# Patient Record
Sex: Female | Born: 1965 | Race: White | Hispanic: No | Marital: Married | Smoking: Never smoker
Health system: Southern US, Community
[De-identification: ages and names within clinical notes are randomized; demographics above are authoritative.]

## PROBLEM LIST (undated history)

## (undated) DIAGNOSIS — C44311 Basal cell carcinoma of skin of nose: Secondary | ICD-10-CM

## (undated) DIAGNOSIS — L719 Rosacea, unspecified: Secondary | ICD-10-CM

## (undated) DIAGNOSIS — F329 Major depressive disorder, single episode, unspecified: Secondary | ICD-10-CM

## (undated) DIAGNOSIS — F419 Anxiety disorder, unspecified: Secondary | ICD-10-CM

## (undated) DIAGNOSIS — J45909 Unspecified asthma, uncomplicated: Secondary | ICD-10-CM

## (undated) DIAGNOSIS — E559 Vitamin D deficiency, unspecified: Secondary | ICD-10-CM

## (undated) DIAGNOSIS — J309 Allergic rhinitis, unspecified: Secondary | ICD-10-CM

## (undated) HISTORY — PX: OTHER SURGICAL HISTORY: SHX169

## (undated) HISTORY — DX: Vitamin D deficiency, unspecified: E55.9

## (undated) HISTORY — DX: Major depressive disorder, single episode, unspecified: F32.9

## (undated) HISTORY — DX: Allergic rhinitis, unspecified: J30.9

## (undated) HISTORY — DX: Basal cell carcinoma of skin of nose: C44.311

## (undated) HISTORY — DX: Anxiety disorder, unspecified: F41.9

## (undated) HISTORY — DX: Rosacea, unspecified: L71.9

## (undated) HISTORY — DX: Unspecified asthma, uncomplicated: J45.909

---

## 1999-02-26 HISTORY — PX: DILATION AND CURETTAGE OF UTERUS: SHX78

## 2001-02-25 DIAGNOSIS — F32A Depression, unspecified: Secondary | ICD-10-CM

## 2001-02-25 HISTORY — DX: Depression, unspecified: F32.A

## 2003-10-24 ENCOUNTER — Encounter (INDEPENDENT_AMBULATORY_CARE_PROVIDER_SITE_OTHER): Payer: Self-pay | Admitting: Specialist

## 2003-10-24 ENCOUNTER — Inpatient Hospital Stay (HOSPITAL_COMMUNITY): Admission: AD | Admit: 2003-10-24 | Discharge: 2003-10-26 | Payer: Self-pay | Admitting: Obstetrics and Gynecology

## 2005-02-21 ENCOUNTER — Other Ambulatory Visit: Admission: RE | Admit: 2005-02-21 | Discharge: 2005-02-21 | Payer: Self-pay | Admitting: Obstetrics and Gynecology

## 2008-05-31 ENCOUNTER — Encounter: Admission: RE | Admit: 2008-05-31 | Discharge: 2008-05-31 | Payer: Self-pay | Admitting: Obstetrics and Gynecology

## 2009-07-13 ENCOUNTER — Encounter: Admission: RE | Admit: 2009-07-13 | Discharge: 2009-07-13 | Payer: Self-pay | Admitting: Obstetrics and Gynecology

## 2010-05-24 ENCOUNTER — Institutional Professional Consult (permissible substitution): Payer: Self-pay | Admitting: Family Medicine

## 2010-07-13 NOTE — H&P (Signed)
NAMEJHANIYA, Porter NO.:  [id number]   MEDICAL RECORD NO.:  [id number]                   PATIENT TYPE:   LOCATION:                                       FACILITY:   PHYSICIAN:  Janine Limbo, M.D.            DATE OF BIRTH:  1965-06-23   DATE OF ADMISSION:  10/24/2003  DATE OF DISCHARGE:                                HISTORY & PHYSICAL   HISTORY OF PRESENT ILLNESS:  [name] is a 45 year old female, gravida  3, para 1, 0, 1, 1, who presents at [redacted] weeks gestation ([facility] November 02, 2003)  for induction of labor.  The patient was followed in [place] up until  [redacted] weeks gestation.  She transferred to [place], [place].  Her  pregnancy has largely been uncomplicated.  The patient did have a slight  elevation in her blood pressure at her most recent visit.  The patient has a  history of postpartum depression and she was treated with Lexapro. The  patient did have an amniocentesis and she was found to have normal  chromosomes.  The patient had a positive beta strep culture in January of  2005 and a positive beta strep culture in the third trimester.   OBSTETRICAL HISTORY:  The patient had a term vaginal delivery in December of  2002 where she delivered a 6 pound female infant.  In 2001 she had a first  trimester miscarriage.   ALLERGIES:  No known drug allergies.   SOCIAL HISTORY:  The patient denies cigarette use, alcohol use, and  recreational drug use.   REVIEW OF SYSTEMS:  Normal pregnancy complaints.   FAMILY HISTORY:  The patient has a family history of heart disease,  hypertension, diabetes, cancer, and strokes.   PHYSICAL EXAMINATION:  Weight is 173 pounds.  HEENT:  Within normal limits.  CHEST:  Clear.  HEART:  Regular rate and rhythm.  BREASTS:    Are without masses.  ABDOMEN:  Is gravid with a fundal height of 38 cm.  EXTREMITIES:  Are within normal limits.  NEUROLOGIC EXAM:  Is grossly normal.  PELVIC EXAM:  Cervix  is 3 cm dilated, 50% effaced, and -2 in station.   LABORATORY VALUES:  Blood type is O positive, antibody screen negative.  Pap  is within normal limits. Rubella is immune. VDRL is nonreactive.  HBSAG is  negative.  Third trimester beta strep is positive.   ASSESSMENT:  1. Thirty-nine-weeks gestation.  2. Slight increase in blood pressure.   PLAN:  The patient will undergo induction of labor.  We will begin Pitocin  augmentation followed by rupture of membranes.  The patient will receive  beta strep prophylaxis. We will keep in mind that the patient had postpartum  depression and we will follow her closely.  Janine Limbo, M.D.    AVS/MEDQ  D:  10/23/2003  T:  10/24/2003  Job:  [id number]

## 2010-07-13 NOTE — Op Note (Signed)
NAME:  Erin Porter, Erin Porter                     ACCOUNT NO.:  [id number]   MEDICAL RECORD NO.:  [id number]                   PATIENT TYPE:  INP   LOCATION:  9111                                 [facility]ACILITY:  [facility]   PHYSICIAN:  Janine Limbo, M.D.            DATE OF BIRTH:  Aug 09, 1965   DATE OF PROCEDURE:  10/24/2003  DATE OF DISCHARGE:                                 OPERATIVE REPORT   PREOPERATIVE DIAGNOSIS:  1.  Term intrauterine pregnancy.  2.  Active labor.   POSTOPERATIVE DIAGNOSIS:  1.  Term intrauterine pregnancy.  2.  Active labor.  3.  Placenta accreta.  4.  Uterine inversion.   OPERATION PERFORMED:  1.  Normal spontaneous vaginal delivery over an intact perineum.  2.  Manual extraction of placenta accreta with scraping of the uterus.  3.  Manual replacement of the uterus.   SURGEON:  Janine Limbo, M.D.   ANESTHESIA:  Epidural.   DISPOSITION:  Erin Porter is a 45 year old female, gravida 3, para 1-0-1-1,  who was admitted at [redacted] weeks gestation for induction of labor.  She was  started on Pitocin on the morning of October 24, 2003. The patient's  membranes were ruptured at approximately 11 o'clock a.m. She quickly dilated  her cervix and became completely dilated.  She was prepared for vaginal  delivery.   FINDINGS:  A 6 pound, 13 ounce female infant Erin Porter) was delivered.  The  Apgars were 8 at one minute and 10 at five minutes.  There was a three-  vessel umbilical cord present.  The patient was found to have a placenta  accreta.  The patient had a uterine inversion.  No lacerations were present  on the perineum or in the vagina.   DESCRIPTION OF PROCEDURE:  The patient was placed in lithotomy position and  prepped for vaginal delivery.  The patient pushed effectively and easily  delivered the fetal head.  The mouth and nose were suctioned.  The remainder  of the infant was then delivered.  The cord was clamped and cut and the  infant was allowed to  remain in the room to bond with the parents.  The  placenta delivered slowly and as the placenta delivered, we noticed that the  uterus inverted with the placenta.  The patient did not have active  bleeding, however.  The patient then had a liter of intravenous fluid hung  and infused rapidly.  Attempts were made to remove the placenta from the  fundus of the uterus that was now outside of the vagina.  These were not  initially successful.  Scissors were then removed to gently peel the  membrane and then the placenta from the fundus of the uterus.  Ancef was  ordered and we awaited its arrival.  The placenta was then sharply and  bluntly removed from the uterus and the cavity was cleaned and scraped with  the scissors.  We then were  able to manually replace the uterus back into  the vagina and then back through the cervix into a more normal anatomic  position.  At this point the Ancef did arrive and the patient was given 1 g  of Ancef.  Pitocin was started and infused rapidly.  The uterus began to  become firm.  Bleeding was acceptable.  The patient tolerated the procedure  well.  She remained comfortable with her epidural.  The estimated blood loss  for this procedure was 400 mL.  The patient tolerated the procedure quite  well.  Her vital signs remained stable.  The patient was allowed to remain  on labor and delivery and her infant was allowed to remain in the room with  the parents.                                               Janine Limbo, M.D.    AVS/MEDQ  D:  10/24/2003  T:  10/25/2003  Job:  [id number]

## 2010-07-13 NOTE — Discharge Summary (Signed)
NAME:  Erin Porter, Erin Porter                     ACCOUNT NO.:  [id number]   MEDICAL RECORD NO.:  [id number]                   PATIENT TYPE:  INP   LOCATION:  9111                                 [facility]ILITY:  [facility]   PHYSICIAN:  Janine Limbo, M.D.            DATE OF BIRTH:  November 03, 1965   DATE OF ADMISSION:  10/24/2003  DATE OF DISCHARGE:                                 DISCHARGE SUMMARY   ADMISSION DIAGNOSES:  1.  Intrauterine pregnancy at 39 weeks.  2.  Mild hypertension.   DISCHARGE DIAGNOSES:  1.  Intrauterine pregnancy at 39 weeks.  2.  Mild hypertension.  3.  Placenta accreta and uterine inversion.   HOSPITAL PROCEDURES:  1.  Induction of labor.  2.  Spontaneous vaginal delivery.  3.  Manual extraction of placenta with scraping of uterus.  4.  Manual replacement of uterus.  5.  Epidural anesthesia.   HOSPITAL COURSE:  The patient was admitted for induction of labor.  Pitocin  augmentation followed amniotomy.  Beta strep prophylaxis was given.  The  patient progressed steadily throughout the day and proceeded to a  spontaneous vaginal delivery of a viable female infant named Greig Castilla, weighing  6 pounds 13 ounces, Apgars of 8 and 10, over an intact perineum.  After  delivery the placenta began to be delivered and the uterus inverted with the  placenta.  Attempts were made to manually remove the placenta and an accreta  was noted.  The placenta was then removed by scraping with scissors and  placenta was sent to pathology.  The uterus was replaced into the abdominal  cavity and Pitocin administration was continued throughout the night.  The  patient did well.  EBL was 400 mL.  Postpartum day #1 she was doing well.  There was no dizziness or syncope.  She was ambulating without problems,  bottle-feeding her infant.  Vital signs were stable.  Hemoglobin was 10.4  and routine postpartum care was given.  Ancef was discontinued and Augmentin  was begun.  On postpartum day #2 the  patient was ready to go home.  She was  up and about without syncope or other problems.  Vital signs were stable;  she was afebrile.  Fundus was firm, lochia was small, perineum healing,  extremities within normal limits.  She was deemed to have received the full  benefit of her hospital stay and was discharged home.   DISCHARGE MEDICATIONS:  1.  Motrin 600 mg p.o. q.6h. p.r.n.  2.  Tylox one to two p.o. q.4h. p.r.n.  3.  Loestrin 1/35 one p.o. daily.  4.  Augmentin 875 mg p.o. q.12h.   DISCHARGE LABORATORY DATA:  White blood cell count 13.9, hemoglobin 10.4,  platelets 133.   DISCHARGE INSTRUCTIONS:  Per [facility] handout.   DISCHARGE FOLLOW-UP:  In 6 weeks or p.r.n. at [facility].     Marie L. Williams, C.N.M.  Janine Limbo, M.D.    MLW/MEDQ  D:  10/26/2003  T:  10/26/2003  Job:  [id number]

## 2010-09-20 ENCOUNTER — Telehealth: Payer: Self-pay | Admitting: Family Medicine

## 2010-09-20 MED ORDER — ALPRAZOLAM 0.5 MG PO TABS: ORAL_TABLET | ORAL | Status: DC

## 2010-09-20 NOTE — Telephone Encounter (Signed)
Patient states she is having significant marital problems, complaining of high stress and lack of sleep.  Past h/o postpartum depression for which she took Lexapro.  She made an appointment to see me Monday.  She was given Raynelle Fanning [facility] name as a suggestion for counseling.  Discussed her anxiety and insomnia; discussed risks/side effects of both alprazolam and ambien--she prefers to try the alprazolam, mainly needing at bedtime.  Denies pregnancy.

## 2010-09-24 ENCOUNTER — Ambulatory Visit (INDEPENDENT_AMBULATORY_CARE_PROVIDER_SITE_OTHER): Payer: BC Managed Care – PPO | Admitting: Family Medicine

## 2010-09-24 ENCOUNTER — Encounter: Payer: Self-pay | Admitting: Family Medicine

## 2010-09-24 VITALS — BP 108/64 | HR 64 | Ht 62.75 in | Wt 136.0 lb

## 2010-09-24 DIAGNOSIS — R5381 Other malaise: Secondary | ICD-10-CM

## 2010-09-24 DIAGNOSIS — F4322 Adjustment disorder with anxiety: Secondary | ICD-10-CM | POA: Insufficient documentation

## 2010-09-24 DIAGNOSIS — R5383 Other fatigue: Secondary | ICD-10-CM

## 2010-09-24 NOTE — Progress Notes (Signed)
Patient presents to discuss anxiety/insomnia.  She returned from a trip to [place] with her sons to return to have a serious discussion with her husband, who realized (while home alone) that he wasn't happy with their marriage.  He feels they are more like roommates, and that his concerns, which have been brought up in the past, have been ignored.  She admits to brushing away his concerns, ignoring their problems as if they didn't exist.  Hearing how unhappy he was, when she thought things were "fine" really hit home for her, and she was extremely anxious about losing her husband/marriage.  She hadn't slept in a few days (see telephone note). She took 1/2 xanax after being prescribed them last week, and was finally able to get to sleep.  She took another 1/2 tablet prior to going to counseling session with him.  She has slept better since then.  They are both intent on getting counseling, and working to fix their marriage.  They have had 2 counseling sessions, and are reading their second book to help them.  She is now feeling more optimistic.  She has a h/o postpartum depression in 2003.  Denies feeling depressed currently--not feeling sad; normal libido.    Past Medical History  Diagnosis Date  . Allergic rhinitis, cause unspecified   . Depression 2003    post partum    Past Surgical History  Procedure Date  . Dilation and curettage of uterus     after miscarriage    History   Social History  . Marital Status: Married    Spouse Name: Darren    Number of Children: 2  . Years of Education: N/A   Occupational History  . Not on file.   Social History Main Topics  . Smoking status: Never Smoker   . Smokeless tobacco: Never Used  . Alcohol Use: Yes     1/2-1 glass of wine per night  . Drug Use: No  . Sexually Active: Yes    Birth Control/ Protection: Other-see comments     husband with vasectomy   Other Topics Concern  . Not on file   Social History Narrative  . No narrative on  file    Family History  Problem Relation Age of Onset  . Allergies Mother   . Hypertension Father   . Cancer Father     skin    Current outpatient prescriptions:ALPRAZolam (XANAX) 0.5 MG tablet, 1/2- 1 tablet every 8 hours as needed for anxiety, Disp: 20 tablet, Rfl: 0;  cetirizine (ZYRTEC) 10 MG tablet, Take 10 mg by mouth daily.  , Disp: , Rfl:   No Known Allergies  ROS:  Denies weight changes, headaches.  She has been having trouble losing the last 10 pounds that she has wanted to, has had some fatigue, and has been asking to have her thyroid checked (but hadn't made an appointment to do so).  She has never had a Vitamin D level checked.  No significant hair/skin changes or bowel changes.  Cycles are normal.  PHYSICAL EXAM: BP 108/64  Pulse 64  Ht 5' 2.75" (1.594 m)  Wt 136 lb (61.689 kg)  BMI 24.28 kg/m2  LMP 09/21/2010 Well developed female, in no distress.  Somewhat emotional, on verge of tears, but held it together Normal mood, affect, hygiene, grooming. Normal speech, eye contact Neck: no lymphadenopathy or thyromegaly  ASSESSMENT/PLAN:  1. Adjustment disorder with anxious mood    2. Fatigue  TSH, Vitamin D 25 hydroxy  Encouraged to continue with counseling (with husband, and/or alone). I do NOT recommend restarting Lexapro at this point.  Continue with prn use of alprazolam.  Recommend re-starting if continued/worsening symptoms of anxiety or depression, or need for frequent alprazolam use.   F/u with me prn; continue counseling  30-35 minutes face to face visit, all counseling

## 2010-09-25 LAB — VITAMIN D 25 HYDROXY (VIT D DEFICIENCY, FRACTURES): Vit D, 25-Hydroxy: 52 ng/mL (ref 30–89)

## 2010-10-12 ENCOUNTER — Ambulatory Visit (INDEPENDENT_AMBULATORY_CARE_PROVIDER_SITE_OTHER): Payer: BC Managed Care – PPO | Admitting: Family Medicine

## 2010-10-12 ENCOUNTER — Encounter: Payer: Self-pay | Admitting: Family Medicine

## 2010-10-12 VITALS — BP 108/68 | HR 64 | Temp 98.1°F | Wt 136.0 lb

## 2010-10-12 DIAGNOSIS — J019 Acute sinusitis, unspecified: Secondary | ICD-10-CM

## 2010-10-12 DIAGNOSIS — J301 Allergic rhinitis due to pollen: Secondary | ICD-10-CM

## 2010-10-12 DIAGNOSIS — J209 Acute bronchitis, unspecified: Secondary | ICD-10-CM

## 2010-10-12 MED ORDER — AMOXICILLIN 875 MG PO TABS: 875.0000 mg | ORAL_TABLET | Freq: Two times a day (BID) | ORAL | Status: AC

## 2010-10-12 NOTE — Progress Notes (Signed)
  Subjective:    Patient ID: Erin Porter, female    DOB: Oct 09, 1965, 45 y.o.   MRN: [id number]  HPI She has a two-week history that started with sneezing, itchy watery eyes, itchy throat with drainage. He then developed a dry cough is now productive and uncomfortable. Approximately for 5 days ago she got worse with increasing sinus congestion productive cough malaise headache and fatigue. She does not smoke. She does have this seasonal allergies spring and fall and uses Zyrtec   Review of Systems     Objective:   Physical Exam alert and in no distress. Tympanic membranes and canals are normal. Throat is clear. Tonsils are normal. Neck is supple without adenopathy or thyromegaly. Cardiac exam shows a regular sinus rhythm without murmurs or gallops. Lungs are clear to auscultation. Nasal mucosa is normal however she is tender over frontal and maxillary sinus       Assessment & Plan:  Sinusitis. Bronchitis. Allergic rhinitis We'll treat with Amoxil. Encouraged her to call if not entirely better

## 2010-10-12 NOTE — Patient Instructions (Signed)
Use DayQuil during the day for coughing and NyQuil at night. Call if not entirely better

## 2010-10-15 ENCOUNTER — Encounter: Payer: Self-pay | Admitting: *Deleted

## 2010-10-19 ENCOUNTER — Telehealth: Payer: Self-pay | Admitting: Family Medicine

## 2010-10-19 ENCOUNTER — Telehealth: Payer: Self-pay

## 2010-10-19 MED ORDER — FLUCONAZOLE 150 MG PO TABS: 150.0000 mg | ORAL_TABLET | Freq: Once | ORAL | Status: AC

## 2010-10-19 MED ORDER — AMOXICILLIN-POT CLAVULANATE 875-125 MG PO TABS: 1.0000 | ORAL_TABLET | Freq: Two times a day (BID) | ORAL | Status: AC

## 2010-10-19 NOTE — Telephone Encounter (Signed)
SEEN LAST FRI. GIVEN ANTIBIOTIC. HAVE NOT SEEN MUCH IMPROVMNT. STILL HAS SINUS HEADACHE,COUCH CHEST CONGESTION. HAS DEVELOPED YEAST INFECT. DOES SHE NEED DIFFERENT ANTIBIOTIC AND GET SOMETHING FOR YEAST? GOING OUT OF TOWN . WLD LIKE TO FEEL SOMEWHAT BETTER FOR TRIP.  [facility]

## 2010-10-19 NOTE — Telephone Encounter (Signed)
Patient is not improving. I will switch her to Augmentin and she also has had some vaginal irritation and therefore I will give her Diflucan

## 2010-10-19 NOTE — Telephone Encounter (Signed)
Tell her I called in Augmentin which is a penicillin plus another medicine that will make it more potent. I also gave her Diflucan. Have her call if not better at the end of the course of the antibiotic

## 2010-10-19 NOTE — Telephone Encounter (Signed)
Pt informed of med called in

## 2010-11-01 ENCOUNTER — Other Ambulatory Visit: Payer: Self-pay | Admitting: Obstetrics and Gynecology

## 2010-11-01 DIAGNOSIS — Z1231 Encounter for screening mammogram for malignant neoplasm of breast: Secondary | ICD-10-CM

## 2010-11-16 ENCOUNTER — Ambulatory Visit: Payer: BC Managed Care – PPO

## 2010-11-20 ENCOUNTER — Ambulatory Visit
Admission: RE | Admit: 2010-11-20 | Discharge: 2010-11-20 | Disposition: A | Discharge status: Home / Self Care | Payer: BC Managed Care – PPO | Source: Ambulatory Visit | Attending: Obstetrics and Gynecology | Admitting: Obstetrics and Gynecology

## 2010-11-20 DIAGNOSIS — Z1231 Encounter for screening mammogram for malignant neoplasm of breast: Secondary | ICD-10-CM

## 2010-11-27 ENCOUNTER — Other Ambulatory Visit: Payer: Self-pay | Admitting: Obstetrics and Gynecology

## 2010-11-27 DIAGNOSIS — R928 Other abnormal and inconclusive findings on diagnostic imaging of breast: Secondary | ICD-10-CM

## 2010-12-05 ENCOUNTER — Other Ambulatory Visit (INDEPENDENT_AMBULATORY_CARE_PROVIDER_SITE_OTHER): Payer: BC Managed Care – PPO

## 2010-12-05 DIAGNOSIS — Z23 Encounter for immunization: Secondary | ICD-10-CM

## 2010-12-06 ENCOUNTER — Ambulatory Visit
Admission: RE | Admit: 2010-12-06 | Discharge: 2010-12-06 | Disposition: A | Discharge status: Home / Self Care | Payer: BC Managed Care – PPO | Source: Ambulatory Visit | Attending: Obstetrics and Gynecology | Admitting: Obstetrics and Gynecology

## 2010-12-06 DIAGNOSIS — R928 Other abnormal and inconclusive findings on diagnostic imaging of breast: Secondary | ICD-10-CM

## 2010-12-18 ENCOUNTER — Telehealth: Payer: Self-pay | Admitting: Family Medicine

## 2010-12-18 DIAGNOSIS — F411 Generalized anxiety disorder: Secondary | ICD-10-CM

## 2010-12-18 MED ORDER — ALPRAZOLAM 0.5 MG PO TABS: ORAL_TABLET | ORAL | Status: DC

## 2010-12-18 NOTE — Telephone Encounter (Signed)
Pt advised that rx was phoned in

## 2011-04-26 ENCOUNTER — Other Ambulatory Visit: Payer: Self-pay | Admitting: Internal Medicine

## 2011-04-26 DIAGNOSIS — F411 Generalized anxiety disorder: Secondary | ICD-10-CM

## 2011-04-26 MED ORDER — ALPRAZOLAM 0.5 MG PO TABS: ORAL_TABLET | ORAL | Status: DC

## 2011-04-26 NOTE — Telephone Encounter (Signed)
Please phone in refill (and sign the pended order when you do). Thanks!

## 2011-04-26 NOTE — Telephone Encounter (Signed)
Called med in per [place]

## 2011-07-09 ENCOUNTER — Telehealth: Payer: Self-pay | Admitting: Internal Medicine

## 2011-07-10 ENCOUNTER — Telehealth: Payer: Self-pay | Admitting: *Deleted

## 2011-07-10 DIAGNOSIS — F411 Generalized anxiety disorder: Secondary | ICD-10-CM

## 2011-07-10 MED ORDER — ALPRAZOLAM 0.5 MG PO TABS: ORAL_TABLET | ORAL | Status: DC

## 2011-07-10 NOTE — Telephone Encounter (Signed)
Last filled 04/26/2010 #20.  Pt with no upcoming appts.  Okay to refill, but recommend that she schedule either OV or CPE before next refill

## 2011-07-10 NOTE — Telephone Encounter (Signed)
Called patient and let her know that Dr.Knapp ok'd refill #20 and was called in to [facility]. She will check her schedule and call back tomorrow or Friday to schedule OV or CPE.

## 2011-09-23 ENCOUNTER — Encounter: Payer: Self-pay | Admitting: Family Medicine

## 2011-09-23 ENCOUNTER — Ambulatory Visit (INDEPENDENT_AMBULATORY_CARE_PROVIDER_SITE_OTHER): Payer: BC Managed Care – PPO | Admitting: Family Medicine

## 2011-09-23 VITALS — BP 110/74 | HR 60 | Ht 62.75 in | Wt 129.0 lb

## 2011-09-23 DIAGNOSIS — F4322 Adjustment disorder with anxiety: Secondary | ICD-10-CM

## 2011-09-23 DIAGNOSIS — Z0000 Encounter for general adult medical examination without abnormal findings: Secondary | ICD-10-CM

## 2011-09-23 DIAGNOSIS — F411 Generalized anxiety disorder: Secondary | ICD-10-CM

## 2011-09-23 LAB — POCT URINALYSIS DIPSTICK
Bilirubin, UA: NEGATIVE
Glucose, UA: NEGATIVE
Urobilinogen, UA: NEGATIVE
pH, UA: 5

## 2011-09-23 LAB — POCT CBG (FASTING - GLUCOSE)-MANUAL ENTRY: Glucose Fasting, POC: 87 mg/dL (ref 70–99)

## 2011-09-23 MED ORDER — ALPRAZOLAM 0.5 MG PO TABS: ORAL_TABLET | ORAL | Status: DC

## 2011-09-23 NOTE — Patient Instructions (Signed)
Discussed monthly self breast exams and yearly mammograms after the age of 55; at least 30 minutes of aerobic activity at least 5 days/week; proper sunscreen use reviewed; healthy diet, including goals of calcium and vitamin D intake and alcohol recommendations (less than or equal to 1 drink/day) reviewed; regular seatbelt use; changing batteries in smoke detectors.  Immunization recommendations discussed.  Colonoscopy recommendations reviewed

## 2011-09-23 NOTE — Progress Notes (Signed)
Chief Complaint  Patient presents with  . Annual Exam    fasting CPE no gyn exam-pt sees Dr.Stringer last pap this past spring. No major concerns.   F/u anxiety--she has been getting individual counseling with Berniece Andreas, and couples counseling with her husband elsewhere.  There was infidelity on her husband's part in the past.  She feels their marriage is in a much better place now, compared to a year ago, and is benefitting from counseling.  Uses xanax occasionally, but knows that she will need it for an upcoming trip to [place] to see her family, as her mother is very manipulative and causes her stress.  Health Maintenance: Immunization History  Administered Date(s) Administered  . Influenza Split 11/03/2008, 12/05/2010  . Tdap 11/03/2008  gets flu shots yearly Last Pap smear: spring 2013 Last mammogram: needed repeat last fall, but repeat was fine.  UTD Last colonoscopy: never Last DEXA: never Dentist: twice yearly Ophtho: wears contacts, goes yearly Exercise: walks daily, some weights and yoga.  Past Medical History  Diagnosis Date  . Depression 2003    post partum  . Allergic rhinitis, cause unspecified (seasonal and perennial)-ragweed, Dr.Whelan  . 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 Dilation and curettage of uterus 2001    after miscarriage  . Cauterization of veins in the nose 1976    History   Social History  . Marital Status: Married    Spouse Name: Darren    Number of Children: 2  . Years of Education: N/A   Occupational History  . accounting    Social History Main Topics  . Smoking status: Never Smoker   . Smokeless tobacco: Never Used  . Alcohol Use: Yes     2-4 glasses of wine per week.  . Drug Use: No  . Sexually Active: Yes    Birth Control/ Protection: Other-see comments     husband with vasectomy   Other Topics Concern  . Not on file   Social History Narrative     Lives with husband, 2 sons, 2 dogs, [place] pigs, fish, frogs    Family History  Problem Relation Age of Onset  . Allergies Mother   . Obesity Mother   . Hypertension Father   . Cancer Father     skin  . Heart attack Maternal Grandmother 21    s/p CABG  . Heart disease Maternal Grandmother     MI in 18's, s/p CABG  . Diabetes Maternal Grandfather   . Stroke Maternal Grandfather     Current outpatient prescriptions:ALPRAZolam (XANAX) 0.5 MG tablet, 1/2- 1 tablet every 8 hours as needed for anxiety, Disp: 30 tablet, Rfl: 0;  cetirizine (ZYRTEC) 10 MG tablet, Take 10 mg by mouth daily.  , Disp: , Rfl: ;  pseudoephedrine (SUDAFED) 120 MG 12 hr tablet, Take 120 mg by mouth every 12 (twelve) hours., Disp: , Rfl:   No Known Allergies  ROS: The patient denies anorexia, fever, weight changes (7# loss from last year, no recent change), headaches,  vision changes, decreased hearing, ear pain, sore throat, breast concerns, chest pain, palpitations, dizziness, syncope, dyspnea on exertion, cough, swelling, nausea, vomiting, diarrhea, constipation, abdominal pain, melena, hematochezia, indigestion/heartburn, hematuria, incontinence, dysuria, irregular menstrual cycles, vaginal discharge, odor or itch, genital lesions, joint pains, numbness, tingling, weakness, tremor, suspicious skin lesions, depression, abnormal bleeding/bruising, or enlarged lymph nodes. Slight congestion recently started, hasn't started back on zyrtec yet.  PHYSICAL EXAM: BP 110/74  Pulse 60  Ht 5' 2.75" (1.594 m)  Wt 129 lb (58.514 kg)  BMI 23.03 kg/m2  LMP 09/02/2011  General Appearance:    Alert, cooperative, no distress, appears stated age  Head:    Normocephalic, without obvious abnormality, atraumatic  Eyes:    PERRL, conjunctiva/corneas clear, EOM's intact, fundi    benign  Ears:    Normal TM's and external ear canals  Nose:   Nares normal, mucosa normal, no drainage or sinus   tenderness  Throat:   Lips, mucosa,  and tongue normal; teeth and gums normal  Neck:   Supple, no lymphadenopathy;  thyroid:  no   enlargement/tenderness/nodules; no carotid   bruit or JVD  Back:    Spine nontender, no curvature, ROM normal, no CVA     tenderness  Lungs:     Clear to auscultation bilaterally without wheezes, rales or     ronchi; respirations unlabored  Chest Wall:    No tenderness or deformity   Heart:    Regular rate and rhythm, S1 and S2 normal, no murmur, rub   or gallop  Breast Exam:    Deferred to GYN  Abdomen:     Soft, non-tender, nondistended, normoactive bowel sounds,    no masses, no hepatosplenomegaly  Genitalia:    Deferred to GYN     Extremities:   No clubbing, cyanosis or edema  Pulses:   2+ and symmetric all extremities  Skin:   Skin color, texture, turgor normal, no rashes or lesions  Lymph nodes:   Cervical, supraclavicular, and axillary nodes normal  Neurologic:   CNII-XII intact, normal strength, sensation and gait; reflexes 2+ and symmetric throughout          Psych:   Normal mood, affect, hygiene and grooming.    Fasting glucose=87  ASSESSMENT/PLAN:  1. Routine general medical examination at a health care facility  Visual acuity screening, POCT Urinalysis Dipstick  2. Adjustment disorder with anxious mood    3. Anxiety state, unspecified  ALPRAZolam (XANAX) 0.5 MG tablet   Anxiety--continue with counseling.  Continue prn use of xanax--return if increasing need.  Discussed monthly self breast exams and yearly mammograms; at least 30 minutes of aerobic activity at least 5 days/week; proper sunscreen use reviewed; healthy diet, including goals of calcium and vitamin D intake and alcohol recommendations (less than or equal to 1 drink/day) reviewed; regular seatbelt use; changing batteries in smoke detectors.  Immunization recommendations discussed--UTD, continue yearly flu shots.  Colonoscopy recommendations reviewed--age 3

## 2011-11-15 ENCOUNTER — Other Ambulatory Visit (INDEPENDENT_AMBULATORY_CARE_PROVIDER_SITE_OTHER): Payer: BC Managed Care – PPO

## 2011-11-15 DIAGNOSIS — Z23 Encounter for immunization: Secondary | ICD-10-CM

## 2011-12-18 ENCOUNTER — Ambulatory Visit: Payer: BC Managed Care – PPO | Admitting: Licensed Clinical Social Worker

## 2012-05-11 ENCOUNTER — Other Ambulatory Visit: Payer: Self-pay

## 2012-06-17 ENCOUNTER — Ambulatory Visit
Admission: RE | Admit: 2012-06-17 | Discharge: 2012-06-17 | Disposition: A | Discharge status: Home / Self Care | Payer: BC Managed Care – PPO | Source: Ambulatory Visit

## 2012-06-17 DIAGNOSIS — Z1231 Encounter for screening mammogram for malignant neoplasm of breast: Secondary | ICD-10-CM

## 2012-09-25 DIAGNOSIS — C44311 Basal cell carcinoma of skin of nose: Secondary | ICD-10-CM

## 2012-09-25 HISTORY — DX: Basal cell carcinoma of skin of nose: C44.311

## 2012-09-28 ENCOUNTER — Other Ambulatory Visit: Payer: Self-pay | Admitting: Dermatology

## 2012-09-30 ENCOUNTER — Encounter: Payer: Self-pay | Admitting: Family Medicine

## 2012-11-17 ENCOUNTER — Other Ambulatory Visit (INDEPENDENT_AMBULATORY_CARE_PROVIDER_SITE_OTHER): Payer: BC Managed Care – PPO

## 2012-11-17 DIAGNOSIS — Z23 Encounter for immunization: Secondary | ICD-10-CM

## 2012-12-02 ENCOUNTER — Encounter: Payer: Self-pay | Admitting: Family Medicine

## 2012-12-02 ENCOUNTER — Ambulatory Visit (INDEPENDENT_AMBULATORY_CARE_PROVIDER_SITE_OTHER): Payer: BC Managed Care – PPO | Admitting: Family Medicine

## 2012-12-02 VITALS — BP 102/60 | HR 68 | Ht 62.5 in | Wt 135.0 lb

## 2012-12-02 DIAGNOSIS — F411 Generalized anxiety disorder: Secondary | ICD-10-CM

## 2012-12-02 MED ORDER — ESCITALOPRAM OXALATE 10 MG PO TABS: 10.0000 mg | ORAL_TABLET | Freq: Every day | ORAL | Status: DC

## 2012-12-02 MED ORDER — ALPRAZOLAM 0.5 MG PO TABS: ORAL_TABLET | ORAL | Status: DC

## 2012-12-02 NOTE — Progress Notes (Signed)
Chief Complaint  Patient presents with  . Advice Only    wonders if using xanax from time to time for anxiety. Uses 30 xanax a year. Not sure if she should be on a daily medication for anxiety that is infrequent, but unsure.    Patient presents to discuss some ongoing anxiety. Doesn't feel overwhelmed daily; denies depression, feeling hopeless. Husband can get irritable if he is down, says things sometimes without thinking of consequences or how it makes her feel, as opposed to her, where she wants to back away and shut down in same situations.  They recently had an argument, where he stated "let's just get divorced"--said it when upset, didn't necessarily truly want that, and he later apologized for saying that.  They had a rough patch over a year ago, in their marriage (infidelity on husband's part) which they had gotten past, doing better, but she reports they will have these arguments that make her extremely upset about every 1-3 months, the last of which was a few days ago. She finds she can't eat, makes her sick to even think of chewing, so has smoothies.  No energy, achey, can't sleep.     She relates their differences in communication are longstanding, related to family dynamics.  His family will throw chairs when angry (and think nothing of that, which is very upsetting to her), whereas she keeps things inside, withdraws, as that is how they were taught to handle things related to when her dad was upset. She finds that shutting down is a mechanism to protect herself.  She had seen Erin Porter for counseling last year,on her own, some counseling together as well but reports that he is quiet, and he didn't really like going to counseling.  Uses xanax very rarely.  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  Took Lexapro without side effects.  She recently completed a course of Imiquimod for basal cell cancer on her nose.  She reports that last week there was some yellow crusting, possibly an odor.   This has improved, and the redness is starting to improve.  She had called the office and was told to use vaseline. Asking for my opinion, felt like her concern was sort of blown off by their office.  Complaining of vaginal discharge and itching, similar to prior yeast infection.  Requesting diflucan prescription.  Had an infection 2 months ago also, gets about 2-3 times/year.  She took Monistat last night (1 dose treatment).  Past Medical History  Diagnosis Date  . Depression 2003    post partum  . Allergic rhinitis, cause unspecified (seasonal and perennial)-ragweed, Dr.Whelan  . 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 Basal cell carcinoma of nose 09/2012    Dr. Amy [place]  . Anxiety    Past Surgical History  Procedure Laterality Date  . Dilation and curettage of uterus  2001    after miscarriage  . Cauterization of veins  in the nose 1976   History   Social History  . Marital Status: Married    Spouse Name: Erin Porter    Number of Children: 2  . Years of Education: N/A   Occupational History  . accounting    Social History Main Topics  . Smoking status: Never Smoker   . Smokeless tobacco: Never Used  . Alcohol Use: Yes     Comment: 2-4 glasses of wine per week.  . Drug Use: No  . Sexual Activity: Yes    Birth  Control/ Protection: Other-see comments     Comment: husband with vasectomy   Other Topics Concern  . Not on file   Social History Narrative   Lives with husband, 2 sons, 2 dogs, [place] pigs, fish, frogs   No current outpatient prescriptions on file prior to visit.   No current facility-administered medications on file prior to visit.   No Known Allergies  ROS:  Denies fevers, chills, nausea, vomiting.  Decreased appetite when upset with husband.  Denies URI symptoms, cough, shortness of breath, chest pain, other skin rashes/lesions, bleeding bruising, or other concerns.  Denies depression, suicidality.  PHYSICAL EXAM: BP  102/60  Pulse 68  Ht 5' 2.5" (1.588 m)  Wt 135 lb (61.236 kg)  BMI 24.28 kg/m2  LMP 11/19/2012 Pleasant female, who became on verge of tears in discussing recent marital difficulties, stressors.  Full range of affect throughout visit.  She has normal affect, hygiene, grooming, eye contact and speech. L nose--red, scabbed, flaky No crusting or drainage  ASSESSMENT/PLAN:  Anxiety state, unspecified - Plan: escitalopram (LEXAPRO) 10 MG tablet, ALPRAZolam (XANAX) 0.5 MG tablet  Counseled extensively--re: communication with her and husband, their differences and how to handle.  Encouraged her to return to [facility] for counseling.  Discussed restarting SSRI, and she is interested in doing so.  Previously tolerated lexapro.  Restart at 1/2 tablet for the first week, then increase to full tablet if tolerated.  Risks/side effects reviewed.  Refilled alprazolam to use sparingly, prn.  Risks/side effects reviewed.  F/u 4-6 weeks, sooner prn.  BCC of nose, with recent imiquimod use, still not healed.  Use bacitracin BID.  Reviewed signs/symptoms of cellulitis/impetigo and to see eval if develops.  Yeast infection--just used OTC med last night.  Advised it may take 3-7 days to see full effect of medication. Call next week for diflucan prescription if yeast symptoms do not resolve.  30 minute visit, all spent counseling.

## 2012-12-02 NOTE — Patient Instructions (Addendum)
Start the lexapro at 1/2 tablet once daily and increase to full tablet after a week if tolerating.  Likely won't see any effect for a couple of weeks, full effect by 4-6 weeks.  Call if having problems or side effects.  Call next week for diflucan prescription if yeast symptoms do not resolve

## 2012-12-15 ENCOUNTER — Ambulatory Visit (INDEPENDENT_AMBULATORY_CARE_PROVIDER_SITE_OTHER): Payer: BC Managed Care – PPO | Admitting: Medical

## 2012-12-15 ENCOUNTER — Encounter: Payer: Self-pay | Admitting: Medical

## 2012-12-15 VITALS — BP 128/80 | HR 58 | Temp 97.8°F | Wt 139.0 lb

## 2012-12-15 DIAGNOSIS — R300 Dysuria: Secondary | ICD-10-CM

## 2012-12-15 LAB — POCT URINALYSIS DIPSTICK
Bilirubin, UA: NEGATIVE
Nitrite, UA: NEGATIVE
Protein, UA: NEGATIVE
pH, UA: 6

## 2012-12-15 MED ORDER — CIPROFLOXACIN HCL 250 MG PO TABS: 250.0000 mg | ORAL_TABLET | Freq: Two times a day (BID) | ORAL | Status: DC

## 2012-12-15 NOTE — Patient Instructions (Signed)
Urinary Tract Infection  Urinary tract infections (UTIs) can develop anywhere along your urinary tract. Your urinary tract is your body's drainage system for removing wastes and extra water. Your urinary tract includes two kidneys, two ureters, a bladder, and a urethra. Your kidneys are a pair of bean-shaped organs. Each kidney is about the size of your fist. They are located below your ribs, one on each side of your spine.  CAUSES  Infections are caused by microbes, which are microscopic organisms, including fungi, viruses, and bacteria. These organisms are so small that they can only be seen through a microscope. Bacteria are the microbes that most commonly cause UTIs.  SYMPTOMS   Symptoms of UTIs may vary by age and gender of the patient and by the location of the infection. Symptoms in young women typically include a frequent and intense urge to urinate and a painful, burning feeling in the bladder or urethra during urination. Older women and men are more likely to be tired, shaky, and weak and have muscle aches and abdominal pain. A fever may mean the infection is in your kidneys. Other symptoms of a kidney infection include pain in your back or sides below the ribs, nausea, and vomiting.  DIAGNOSIS  To diagnose a UTI, your caregiver will ask you about your symptoms. Your caregiver also will ask to provide a urine sample. The urine sample will be tested for bacteria and white blood cells. White blood cells are made by your body to help fight infection.  TREATMENT   Typically, UTIs can be treated with medication. Because most UTIs are caused by a bacterial infection, they usually can be treated with the use of antibiotics. The choice of antibiotic and length of treatment depend on your symptoms and the type of bacteria causing your infection.  HOME CARE INSTRUCTIONS   If you were prescribed antibiotics, take them exactly as your caregiver instructs you. Finish the medication even if you feel better after you  have only taken some of the medication.   Drink enough water and fluids to keep your urine clear or pale yellow.   Avoid caffeine, tea, and carbonated beverages. They tend to irritate your bladder.   Empty your bladder often. Avoid holding urine for long periods of time.   Empty your bladder before and after sexual intercourse.   After a bowel movement, women should cleanse from front to back. Use each tissue only once.  SEEK MEDICAL CARE IF:    You have back pain.   You develop a fever.   Your symptoms do not begin to resolve within 3 days.  SEEK IMMEDIATE MEDICAL CARE IF:    You have severe back pain or lower abdominal pain.   You develop chills.   You have nausea or vomiting.   You have continued burning or discomfort with urination.  MAKE SURE YOU:    Understand these instructions.   Will watch your condition.   Will get help right away if you are not doing well or get worse.  Document Released: 11/21/2004 Document Revised: 08/13/2011 Document Reviewed: 03/22/2011  [employer] Patient Information 2014 [employer].

## 2012-12-15 NOTE — Progress Notes (Addendum)
Subjective:  Erin Porter is a 47 y.o. female who complains of possible urinary tract infection.  She has had symptoms for 1 week.  Symptoms include cloudy urine, urinary frequency, some urgency, feeling of incomplete emptying.  she reports burning with urination.  Had some back pain but attributed to a recent hike.  Patient denies fever, no blood in urine.  No abdominal pain, NVD, no vaginal discharge.  Last UTI was 20 years ago.   Using cranberry juice for current symptoms and this seemed to clear up symptoms, but symptoms returned again yesterday, kept her up last night.  Patient does not have a history of recurrent UTI. Patient does not have a history of pyelonephritis.  No other aggravating or relieving factors.  No other c/o.  Past Medical History  Diagnosis Date  . Depression 2003    post partum  . Allergic rhinitis, cause unspecified (seasonal and perennial)-ragweed, Dr.Whelan  . 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 Basal cell carcinoma of nose 09/2012    Dr. Amy [place]  . Anxiety     ROS as in subjective  Reviewed allergies, medications, past medical, surgical, and social history.    Objective: Filed Vitals:   12/15/12 0948  BP: 128/80  Pulse: 58  Temp: 97.8 F (36.6 C)    General appearance: alert, no distress, WD/WN, female Abdomen: +bs, soft, mild suprapubic tenderness, otherwise non tender, non distended, no masses, no hepatomegaly, no splenomegaly, no bruits Back: no CVA tenderness GU: not examined    Assessment: Encounter Diagnosis  Name Primary?  . Dysuria Yes    Plan: Discussed symptoms, diagnosis, possible complications, and usual course of illness.  Begin medication Cipro per orders below.  Advised increased water intake, can use OTC Tylenol for pain.    Advised she can c/t cranberry juice.      Urine culture sent.  Of note she mentions prior yeast infection with antibiotics, we will use watch and wait approach.   Of note,she had flu shot at another appt.  Call or return if worse or not improving.

## 2012-12-17 LAB — URINE CULTURE

## 2013-02-04 ENCOUNTER — Telehealth: Payer: Self-pay | Admitting: *Deleted

## 2013-02-04 ENCOUNTER — Telehealth: Payer: Self-pay | Admitting: Family Medicine

## 2013-02-04 ENCOUNTER — Other Ambulatory Visit: Payer: Self-pay | Admitting: *Deleted

## 2013-02-04 DIAGNOSIS — F411 Generalized anxiety disorder: Secondary | ICD-10-CM

## 2013-02-04 MED ORDER — ESCITALOPRAM OXALATE 10 MG PO TABS: 10.0000 mg | ORAL_TABLET | Freq: Every day | ORAL | Status: DC

## 2013-02-04 NOTE — Telephone Encounter (Signed)
Spoke with patient and she states that the lexapro has really made a difference. She has not had any negative side effects at all. Would really like to wait to come in for 6 months as opposed to now. Refilled her rx.x 6 months and scheduled her med check appointment for 06/28/13.

## 2013-02-04 NOTE — Telephone Encounter (Signed)
She was supposed to f/u in the office at 4-6 weeks after restarting the Lexapro.  It does not appear that she has appointment scheduled.  If it is working perfectly, no side effects, and she declines OV at this time, go ahead and refill x 6 months and set up med check for 6 months

## 2013-06-28 ENCOUNTER — Encounter: Payer: Self-pay | Admitting: Family Medicine

## 2013-06-28 ENCOUNTER — Ambulatory Visit (INDEPENDENT_AMBULATORY_CARE_PROVIDER_SITE_OTHER): Payer: BC Managed Care – PPO | Admitting: Family Medicine

## 2013-06-28 VITALS — BP 120/70 | HR 60 | Ht 62.5 in | Wt 140.0 lb

## 2013-06-28 DIAGNOSIS — F411 Generalized anxiety disorder: Secondary | ICD-10-CM

## 2013-06-28 MED ORDER — ESCITALOPRAM OXALATE 10 MG PO TABS: 10.0000 mg | ORAL_TABLET | Freq: Every day | ORAL | Status: DC

## 2013-06-28 NOTE — Patient Instructions (Signed)
Continue the Lexapro. Consider cutting back to 1/2 tablet once daily for 1-2 months if/when stressors are less, and then consider tapering off completely if no recurrence of symptoms at the lower dose.

## 2013-06-28 NOTE — Progress Notes (Signed)
Chief Complaint  Patient presents with  . Med check    for Lexapro.    She and Darrin are "in a much better place" Communication is better--he is more aware of what he says and how it makes her feel.  She reports that he is also now on medication. 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  Thinks "busy season" is about to start.  She is hesitant to consider cutting back on the dose, in anticipation of more stressful time.  This is also the first summer that she will be working outside the home.  She is concerned about the weight she has gained--about 5 pounds, but would like to lose more than that.  She hasn't found time in her routine to get regular exercise.  She knows that exercise has been helpful with her moods in the past, and would like to restart.  She otherwise has been doing well.  She is now seeing Dr. Pablo Ledger as dermatologist.  She recent had some treatment on her L face (topical)  Past Medical History  Diagnosis Date  . Depression 2003    post partum  . Allergic rhinitis, cause unspecified (seasonal and perennial)-ragweed, Dr.Whelan  . 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 Basal cell carcinoma of nose 09/2012    Dr. Amy Martinique  . Anxiety    Past Surgical History  Procedure Laterality Date  . Dilation and curettage of uterus  2001    after miscarriage  . Cauterization of veins  in the nose 1976   History   Social History  . Marital Status: Married    Spouse Name: Darren    Number of Children: 2  . Years of Education: N/A   Occupational History  . accounting    Social History Main Topics  . Smoking status: Never Smoker   . Smokeless tobacco: Never Used  . Alcohol Use: Yes     Comment: 2-4 glasses of wine per week.  . Drug Use: No  . Sexual Activity: Yes    Birth Control/  Protection: Other-see comments     Comment: husband with vasectomy   Other Topics Concern  . Not on file   Social History Narrative   Lives with husband, 2 sons, 2 dogs, 2 [place] pigs, fish, frogs, 3 chickens.   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 No fevers, chills, URI symptoms, chest pain, headaches, dizziness, nausea, vomiting, bleeding/bruising/rash or any other complaints.  PHYSICAL EXAM: BP 120/70  Pulse 60  Ht 5' 2.5" (1.588 m)  Wt 140 lb (63.504 kg)  BMI 25.18 kg/m2  LMP 06/15/2013 Well developed, pleasant female in no distress Normal speech, eye contact, hygiene and grooming Normal mood, affect  ASSESSMENT/PLAN:  Anxiety state, unspecified - Plan: escitalopram (LEXAPRO) 10 MG tablet  Doing well. Discussed considering decreasing to 1/2 tablet for 1-2 months in the fall, when things seem calmer, less stressful.  Consider  tapering off entirely if doing well, vs increasing back to full tablet if recurrent symptoms develop on lower dose.  Discussed how to incorporate exercise into daily routine  25 min visit, more than 1/2 spent counseling

## 2013-11-18 ENCOUNTER — Other Ambulatory Visit: Payer: Self-pay | Admitting: Family Medicine

## 2013-11-18 NOTE — Telephone Encounter (Signed)
Is this okay to call in? 

## 2013-11-18 NOTE — Telephone Encounter (Signed)
Last filled for #30 in October, had visit in May. Okay for #30, no refill

## 2013-12-27 ENCOUNTER — Encounter: Payer: Self-pay | Admitting: Family Medicine

## 2014-04-01 ENCOUNTER — Other Ambulatory Visit: Payer: Self-pay

## 2014-04-01 DIAGNOSIS — Z1231 Encounter for screening mammogram for malignant neoplasm of breast: Secondary | ICD-10-CM

## 2014-04-11 ENCOUNTER — Ambulatory Visit: Payer: Self-pay

## 2014-04-25 LAB — HM PAP SMEAR: HM Pap smear: NORMAL

## 2014-04-28 ENCOUNTER — Encounter: Payer: Self-pay | Admitting: *Deleted

## 2014-05-30 ENCOUNTER — Other Ambulatory Visit: Payer: Self-pay | Admitting: Family Medicine

## 2014-05-30 NOTE — Telephone Encounter (Signed)
Is this okay to refill? 

## 2014-05-30 NOTE — Telephone Encounter (Signed)
[facility] for #30, no refill. She needs to schedule med check in May (last seen 06/2013)

## 2014-05-30 NOTE — Telephone Encounter (Signed)
Patient scheduled for 07/04/14. RX phoned in.

## 2014-06-20 ENCOUNTER — Ambulatory Visit
Admission: RE | Admit: 2014-06-20 | Discharge: 2014-06-20 | Disposition: A | Discharge status: Home / Self Care | Payer: BLUE CROSS/BLUE SHIELD | Source: Ambulatory Visit

## 2014-06-20 DIAGNOSIS — Z1231 Encounter for screening mammogram for malignant neoplasm of breast: Secondary | ICD-10-CM

## 2014-06-26 DIAGNOSIS — E559 Vitamin D deficiency, unspecified: Secondary | ICD-10-CM

## 2014-06-26 HISTORY — DX: Vitamin D deficiency, unspecified: E55.9

## 2014-07-04 ENCOUNTER — Encounter: Payer: Self-pay | Admitting: Family Medicine

## 2014-07-04 ENCOUNTER — Ambulatory Visit (INDEPENDENT_AMBULATORY_CARE_PROVIDER_SITE_OTHER): Payer: BLUE CROSS/BLUE SHIELD | Admitting: Family Medicine

## 2014-07-04 VITALS — BP 124/70 | HR 60 | Ht 62.5 in | Wt 148.4 lb

## 2014-07-04 DIAGNOSIS — Z13220 Encounter for screening for lipoid disorders: Secondary | ICD-10-CM | POA: Diagnosis not present

## 2014-07-04 DIAGNOSIS — R5383 Other fatigue: Secondary | ICD-10-CM

## 2014-07-04 DIAGNOSIS — F411 Generalized anxiety disorder: Secondary | ICD-10-CM

## 2014-07-04 LAB — CBC WITH DIFFERENTIAL/PLATELET
BASOS ABS: 0.1 10*3/uL (ref 0.0–0.1)
BASOS PCT: 1 % (ref 0–1)
EOS PCT: 2 % (ref 0–5)
Eosinophils Absolute: 0.2 10*3/uL (ref 0.0–0.7)
HCT: 37.9 % (ref 36.0–46.0)
Hemoglobin: 12.5 g/dL (ref 12.0–15.0)
Lymphocytes Relative: 28 % (ref 12–46)
Lymphs Abs: 2.2 10*3/uL (ref 0.7–4.0)
MCH: 28.8 pg (ref 26.0–34.0)
MCHC: 33 g/dL (ref 30.0–36.0)
MCV: 87.3 fL (ref 78.0–100.0)
MPV: 10.1 fL (ref 8.6–12.4)
Monocytes Absolute: 0.4 10*3/uL (ref 0.1–1.0)
Monocytes Relative: 5 % (ref 3–12)
Neutro Abs: 5.1 10*3/uL (ref 1.7–7.7)
Neutrophils Relative %: 64 % (ref 43–77)
PLATELETS: 243 10*3/uL (ref 150–400)
RBC: 4.34 MIL/uL (ref 3.87–5.11)
RDW: 13.7 % (ref 11.5–15.5)
WBC: 8 10*3/uL (ref 4.0–10.5)

## 2014-07-04 MED ORDER — ESCITALOPRAM OXALATE 10 MG PO TABS: 10.0000 mg | ORAL_TABLET | Freq: Every day | ORAL | Status: DC

## 2014-07-04 NOTE — Patient Instructions (Addendum)
Continue your same medications.  1200mg  of calcium daily Vitamin D [phone] (up to 2000 IU) once daily Consider Viactiv chews, and/or Vitamin D3 supplement depending on how much calcium you're getting in your diet.  It is recommended that you get at least 30 minutes of aerobic exercise at least 5 days/week (for weight loss, you may need as much as 60-90 minutes). This can be any activity that gets your heart rate up. This can be divided in 10-15 minute intervals if needed, but try and build up your endurance at least once a week.  Weight bearing exercise is also recommended twice weekly.  We will be in contact with your lab results within the next 1-2 days.

## 2014-07-04 NOTE — Progress Notes (Signed)
Chief Complaint  Patient presents with  . Anxiety    med check.    Patient presents for follow up on anxiety.  Last discussed a year ago. 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  She took 1/2 tablet for 2 weeks, and she was irritable, snapping, and had to go back up to the full tablet.  Feeling much better--denies any anxiety, feels "competely fine", handles things well.  Denies side effects.  She feels tired, unsure if from the medication.  She awakens at 5:30, goes to bed at 9, reads some and then falls asleep.  Some low energy, tired feeling during the day. Hasn't been getting regular exercise like she knows she should.  Not good about taking vitamins/supplements regularly.  Filled #30 of alprazolam on 9/24 related to her stepfather's death. Still has a couple from that prescription left.  Filled another rx in April for #30 that she hasn't used (got it refilled in anticipation of stressful times being around her family, but didn't need).  Dry skin just in the winter, some weight gain (less active since working more). No bowel or hair changes.  PMH, PSH, SH reviewed.  Outpatient Encounter Prescriptions as of 07/04/2014  Medication Sig  . ALPRAZolam (XANAX) 0.5 MG tablet TAKE 1/2 TO 1 TABLET BY MOUTH EVERY 8 HOURS AS NEEDED FOR ANXIETY  . escitalopram (LEXAPRO) 10 MG tablet Take 1 tablet (10 mg total) by mouth daily.  . [DISCONTINUED] escitalopram (LEXAPRO) 10 MG tablet Take 1 tablet (10 mg total) by mouth daily.  . fluorouracil (EFUDEX) 5 % cream Apply 1 application topically 2 (two) times daily.   No facility-administered encounter medications on file as of 07/04/2014.   No Known Allergies  ROS: +fatigue, weight gain.  No URI symptoms, chest pain, palpitations, GI or GU complaints.  Sees derm regularly (h/o skin cancer). No bleeding, bruising, rash, depression or other complaints except as noted in HPI.  PHYSICAL EXAM: BP 124/70 mmHg   Pulse 60  Ht 5' 2.5" (1.588 m)  Wt 148 lb 6.4 oz (67.314 kg)  BMI 26.69 kg/m2  Well developed, pleasant female in no distress HEENT: PERRL, EOMI, conjunctiva clear. OP clear.  Sinuses nontender Neck: no lymphadenopathy, thyromegaly or carotid bruit Heart: regular rate and rhythm without murmur Lungs: clear bilaterally Back: no CVA tenderness Abdomen: soft, nontender, no organomegaly or mass Extremities: no edema,  Skin: no rashes Neuro: alert and oriented. Normal strength, gait Psych: normal mood, affect, hygiene and grooming  ASSESSMENT/PLAN:  Anxiety state - Plan: escitalopram (LEXAPRO) 10 MG tablet  Other fatigue - Plan: Comprehensive metabolic panel, CBC with Differential/Platelet, Vit D  25 hydroxy (rtn osteoporosis monitoring), TSH  Screening for lipid disorders - Plan: Lipid panel  Anxiety--well controlled, with recurrent symptoms when dose lowered for just 2 weeks.  Continue long-term.  Has xanax to use prn (at least 32 tablets)  1200mg  of calcium daily--counseled re: foods/sources of calcium Vitamin D [phone] (up to 2000 IU) once daily--discussed sources, supplements. Consider Viactiv chews, and/or Vitamin D3 supplement depending on how much calcium you're getting in your diet.  Encouraged regular exercise, including weight-bearing exercise. Check labs today given complaints of fatigue.  Previously had normal Vitamin D level, but this was prior to skin cancer, so more careful now with sunscreen ,and also not currently taking any supplements.  Ate 1.5-2 hours ago (chicken, vegetables, potato)--nonfasting labs (so glu and TG may be elevated)  25-30 min visit, more than 1/2  spent counseling.

## 2014-07-05 LAB — VITAMIN D 25 HYDROXY (VIT D DEFICIENCY, FRACTURES): Vit D, 25-Hydroxy: 26 ng/mL — ABNORMAL LOW (ref 30–100)

## 2014-07-05 LAB — COMPREHENSIVE METABOLIC PANEL
ALT: 12 U/L (ref 0–35)
AST: 15 U/L (ref 0–37)
Albumin: 4.1 g/dL (ref 3.5–5.2)
Alkaline Phosphatase: 54 U/L (ref 39–117)
BUN: 15 mg/dL (ref 6–23)
CHLORIDE: 103 meq/L (ref 96–112)
CO2: 30 mEq/L (ref 19–32)
Calcium: 8.8 mg/dL (ref 8.4–10.5)
Creat: 1.09 mg/dL (ref 0.50–1.10)
Glucose, Bld: 100 mg/dL — ABNORMAL HIGH (ref 70–99)
Potassium: 4.2 mEq/L (ref 3.5–5.3)
Sodium: 141 mEq/L (ref 135–145)
Total Bilirubin: 0.4 mg/dL (ref 0.2–1.2)
Total Protein: 6.3 g/dL (ref 6.0–8.3)

## 2014-07-05 LAB — LIPID PANEL
CHOL/HDL RATIO: 2.1 ratio
Cholesterol: 167 mg/dL (ref 0–200)
HDL: 80 mg/dL (ref >=46)
LDL Cholesterol: 67 mg/dL (ref 0–99)
Triglycerides: 98 mg/dL (ref <150)
VLDL: 20 mg/dL (ref 0–40)

## 2014-07-05 LAB — TSH: TSH: 0.756 u[IU]/mL (ref 0.350–4.500)

## 2014-10-12 ENCOUNTER — Encounter: Payer: Self-pay | Admitting: Family Medicine

## 2014-10-12 ENCOUNTER — Ambulatory Visit (INDEPENDENT_AMBULATORY_CARE_PROVIDER_SITE_OTHER): Payer: BLUE CROSS/BLUE SHIELD | Admitting: Family Medicine

## 2014-10-12 VITALS — BP 110/70 | HR 80 | Temp 98.6°F | Ht 63.0 in | Wt 144.8 lb

## 2014-10-12 DIAGNOSIS — R319 Hematuria, unspecified: Secondary | ICD-10-CM | POA: Diagnosis not present

## 2014-10-12 DIAGNOSIS — F4322 Adjustment disorder with anxiety: Secondary | ICD-10-CM

## 2014-10-12 DIAGNOSIS — Z630 Problems in relationship with spouse or partner: Secondary | ICD-10-CM | POA: Diagnosis not present

## 2014-10-12 DIAGNOSIS — R300 Dysuria: Secondary | ICD-10-CM | POA: Diagnosis not present
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
## 2014-10-12 NOTE — Patient Instructions (Signed)
Restart counseling either with Erin Porter or Erin Porter. Consider increasing the lexapro to 1.5 tablets (15mg ) for a week, and then up to 20mg  (2 tablets) if needing the alprazolam frequently for treatment of anxiety (whether you truly take it or not, just feeling like you need it).  Call for 20mg  precription if you get up to the 20mg  and are tolerating it without side effects.  Touch base in the next 2-3 weeks to give me an update.  We will be in touch with your urine culture results within the next few days.  The STD screen should be back tomorrow.  Let us know when you need alprazolam refills (call/MyChart when you are down to 2-3 pills)

## 2014-10-13 LAB — POCT URINALYSIS DIPSTICK
BILIRUBIN UA: NEGATIVE
GLUCOSE UA: NEGATIVE
Ketones, UA: NEGATIVE
Leukocytes, UA: NEGATIVE
NITRITE UA: NEGATIVE
Protein, UA: NEGATIVE
Spec Grav, UA: 1.01
Urobilinogen, UA: NEGATIVE
pH, UA: 6

## 2014-10-13 LAB — GC/CHLAMYDIA PROBE AMP
CT PROBE, AMP APTIMA: NEGATIVE
GC Probe RNA: NEGATIVE

## 2014-10-13 NOTE — Addendum Note (Signed)
Addended by: Carolee Rota F on: 10/13/2014 09:11 AM   Modules accepted: Orders

## 2014-10-14 ENCOUNTER — Other Ambulatory Visit: Payer: Self-pay | Admitting: Family Medicine

## 2014-10-14 ENCOUNTER — Encounter: Payer: Self-pay | Admitting: Family Medicine

## 2014-10-14 DIAGNOSIS — N3001 Acute cystitis with hematuria: Secondary | ICD-10-CM

## 2014-10-14 LAB — URINE CULTURE: Colony Count: >=100000

## 2014-10-14 MED ORDER — CIPROFLOXACIN HCL 250 MG PO TABS: 250.0000 mg | ORAL_TABLET | Freq: Two times a day (BID) | ORAL | Status: DC

## 2015-04-04 ENCOUNTER — Telehealth: Payer: Self-pay | Admitting: Family Medicine

## 2015-04-04 NOTE — Telephone Encounter (Signed)
Advise pt--I reviewed her chart, and we did a bunch of labs in May 2016.  Her lipids were excellent then, normal CBC, TSH, chem panel.  nonfasting glucose was 100.  The only thing I would repeat is Vitamin D, and then we could discuss at visit if any other tests are indicated (ie HIV, etc).  None would be fasting, so we could draw at her afternoon physical.  If she has a particular complaint or concern, or wants a specific test, please let me know.  But I'm fine with drawing nonfasting labs at her physical rather than prior

## 2015-04-04 NOTE — Telephone Encounter (Signed)
Patient scheduled cpe  & pap ( she wants you to start doing paps now) 05/10/15 in the afternoon. Can she come in earlier for fasting labs?

## 2015-04-05 NOTE — Telephone Encounter (Signed)
Left message for pt to call me back 

## 2015-04-06 NOTE — Telephone Encounter (Signed)
Patient advised.

## 2015-04-26 DIAGNOSIS — L719 Rosacea, unspecified: Secondary | ICD-10-CM

## 2015-04-26 HISTORY — DX: Rosacea, unspecified: L71.9

## 2015-05-10 ENCOUNTER — Encounter: Payer: Self-pay | Admitting: Family Medicine

## 2015-05-10 ENCOUNTER — Ambulatory Visit (INDEPENDENT_AMBULATORY_CARE_PROVIDER_SITE_OTHER): Payer: 59 | Admitting: Family Medicine

## 2015-05-10 VITALS — BP 112/62 | HR 64 | Ht 62.5 in | Wt 148.8 lb

## 2015-05-10 DIAGNOSIS — R21 Rash and other nonspecific skin eruption: Secondary | ICD-10-CM

## 2015-05-10 DIAGNOSIS — E559 Vitamin D deficiency, unspecified: Secondary | ICD-10-CM

## 2015-05-10 DIAGNOSIS — Z0000 Encounter for general adult medical examination without abnormal findings: Secondary | ICD-10-CM

## 2015-05-10 DIAGNOSIS — Z30018 Encounter for initial prescription of other contraceptives: Secondary | ICD-10-CM | POA: Diagnosis not present

## 2015-05-10 DIAGNOSIS — F411 Generalized anxiety disorder: Secondary | ICD-10-CM | POA: Diagnosis not present

## 2015-05-10 DIAGNOSIS — Z113 Encounter for screening for infections with a predominantly sexual mode of transmission: Secondary | ICD-10-CM | POA: Diagnosis not present

## 2015-05-10 LAB — POCT URINALYSIS DIPSTICK
Bilirubin, UA: NEGATIVE
Glucose, UA: NEGATIVE
KETONES UA: NEGATIVE
Leukocytes, UA: NEGATIVE
Nitrite, UA: NEGATIVE
Protein, UA: NEGATIVE
Spec Grav, UA: >=1.03
Urobilinogen, UA: NEGATIVE
pH, UA: 6

## 2015-05-10 MED ORDER — ETONOGESTREL-ETHINYL ESTRADIOL 0.12-0.015 MG/24HR VA RING: VAGINAL_RING | VAGINAL | Status: DC

## 2015-05-10 NOTE — Patient Instructions (Signed)
  HEALTH MAINTENANCE RECOMMENDATIONS:  It is recommended that you get at least 30 minutes of aerobic exercise at least 5 days/week (for weight loss, you may need as much as 60-90 minutes). This can be any activity that gets your heart rate up. This can be divided in 10-15 minute intervals if needed, but try and build up your endurance at least once a week.  Weight bearing exercise is also recommended twice weekly.  Eat a healthy diet with lots of vegetables, fruits and fiber.  "Colorful" foods have a lot of vitamins (ie green vegetables, tomatoes, red peppers, etc).  Limit sweet tea, regular sodas and alcoholic beverages, all of which has a lot of calories and sugar.  Up to 1 alcoholic drink daily may be beneficial for women (unless trying to lose weight, watch sugars).  Drink a lot of water.  Calcium recommendations are 1200-1500 mg daily (1500 mg for postmenopausal women or women without ovaries), and vitamin D 1000 IU daily.  This should be obtained from diet and/or supplements (vitamins), and calcium should not be taken all at once, but in divided doses.  Monthly self breast exams and yearly mammograms for women over the age of 56 is recommended.  Sunscreen of at least SPF 30 should be used on all sun-exposed parts of the skin when outside between the hours of 10 am and 4 pm (not just when at beach or pool, but even with exercise, golf, tennis, and yard work!)  Use a sunscreen that says "broad spectrum" so it covers both UVA and UVB rays, and make sure to reapply every 1-2 hours.  Remember to change the batteries in your smoke detectors when changing your clock times in the spring and fall.  Use your seat belt every time you are in a car, and please drive safely and not be distracted with cell phones and texting while driving.   Start the [employer] this Sunday (or wait until your next cycle). If you decide not to do the Nuva Ring, and to do IUD instead, contact Dr. Doran Stabler office for IUD  placement.
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
## 2015-05-11 LAB — VITAMIN D 25 HYDROXY (VIT D DEFICIENCY, FRACTURES): Vit D, 25-Hydroxy: 26 ng/mL — ABNORMAL LOW (ref 30–100)

## 2015-05-11 LAB — GC/CHLAMYDIA PROBE AMP
CT PROBE, AMP APTIMA: NOT DETECTED
GC PROBE AMP APTIMA: NOT DETECTED

## 2015-05-11 LAB — RPR

## 2015-05-11 LAB — HIV ANTIBODY (ROUTINE TESTING W REFLEX): HIV 1&2 Ab, 4th Generation: NONREACTIVE

## 2015-05-15 LAB — HEPATITIS B SURFACE ANTIGEN: Hepatitis B Surface Ag: NEGATIVE

## 2015-05-18 ENCOUNTER — Encounter: Payer: Self-pay | Admitting: Family Medicine

## 2015-09-23 ENCOUNTER — Other Ambulatory Visit: Payer: Self-pay | Admitting: Family Medicine

## 2015-09-23 DIAGNOSIS — F411 Generalized anxiety disorder: Secondary | ICD-10-CM

## 2015-10-25 ENCOUNTER — Other Ambulatory Visit: Payer: Self-pay | Admitting: Family Medicine

## 2015-10-25 NOTE — Telephone Encounter (Signed)
Is this okay to refill? 

## 2015-10-25 NOTE — Telephone Encounter (Signed)
Last filled 2016; last seen 04/2015.  Okay to refill (no add'l refills)

## 2015-12-31 ENCOUNTER — Other Ambulatory Visit: Payer: Self-pay | Admitting: Family Medicine

## 2015-12-31 DIAGNOSIS — F411 Generalized anxiety disorder: Secondary | ICD-10-CM

## 2016-02-29 ENCOUNTER — Encounter: Payer: Self-pay | Admitting: Family Medicine

## 2016-02-29 ENCOUNTER — Other Ambulatory Visit: Payer: Self-pay | Admitting: Family Medicine

## 2016-02-29 DIAGNOSIS — Z1231 Encounter for screening mammogram for malignant neoplasm of breast: Secondary | ICD-10-CM

## 2016-03-01 ENCOUNTER — Other Ambulatory Visit: Payer: 59

## 2016-03-01 ENCOUNTER — Ambulatory Visit
Admission: RE | Admit: 2016-03-01 | Discharge: 2016-03-01 | Disposition: A | Discharge status: Home / Self Care | Payer: 59 | Source: Ambulatory Visit | Attending: Family Medicine | Admitting: Family Medicine

## 2016-03-01 ENCOUNTER — Other Ambulatory Visit (INDEPENDENT_AMBULATORY_CARE_PROVIDER_SITE_OTHER): Payer: 59

## 2016-03-01 DIAGNOSIS — Z23 Encounter for immunization: Secondary | ICD-10-CM | POA: Diagnosis not present

## 2016-03-01 DIAGNOSIS — Z1231 Encounter for screening mammogram for malignant neoplasm of breast: Secondary | ICD-10-CM

## 2016-08-13 ENCOUNTER — Encounter: Payer: Self-pay | Admitting: Family Medicine

## 2016-08-13 ENCOUNTER — Other Ambulatory Visit: Payer: Self-pay | Admitting: Family Medicine

## 2016-08-13 DIAGNOSIS — F411 Generalized anxiety disorder: Secondary | ICD-10-CM

## 2016-08-13 MED ORDER — ESCITALOPRAM OXALATE 10 MG PO TABS: ORAL_TABLET | ORAL | 0 refills | Status: DC

## 2016-08-13 NOTE — Telephone Encounter (Signed)
Sent in med and also sent pt back a mychart message about calling office to schedule an appt

## 2016-10-07 ENCOUNTER — Encounter: Payer: Self-pay | Admitting: Family Medicine

## 2016-10-10 NOTE — Progress Notes (Signed)
Chief Complaint  Patient presents with  . Vaginal Bleeding    reports vaginal bleeding for 16 days. here for that and thyroid checked. reports concern with wt gain.    Patient presents to discuss irregular menses.  Her current cycle has lasted 16 days, stopped bleeding yesterday.  Cycles are usually 4-5 days. After the normal cycle, it seemed like it was over, and the next day started back again.  It was never heavy, but more than simple spotting.  She had one cycle that was 45 days, then another cycle 2 weeks later. Periods only started being irregular in the last few months. Denies hot flashes.  She had been off Lexapro for a few months, but in June started to feel more edgy, low energy. She contacted [place] to refill her Lexapro, as she feels she deals with things better when taking Lexapro. She had reported decreased energy & motivation.  One ongoing stressor is having to work with her ex husband every day.  She is involved with someone else, and recently got engaged.  She reports doing better since being back on the Lexapro. [name] has also noticed that she seems calmer. Denies sexual side effects or other side effects.  Contraception--fiance has vasectomy.  H/o borderline low Vitamin D levels.  Last Vitamin D-OH level was 26 in 04/2015. She is currently taking a daily gummy MVI (unsure of D dose).   +low energy, fatigue.  Less cold-natured than in the past. Weight gain--eating out more, not exercising as much. Drinking more alcohol than prior routine--now has 1-2 glasses of wine/d (one while cooking, another when relaxing after dinner; previously only had 5 glasses/week).   PMH, PSH, SH reviewed  Outpatient Encounter Prescriptions as of 10/11/2016  Medication Sig  . escitalopram (LEXAPRO) 10 MG tablet TAKE 1 TABLET(10 MG) BY MOUTH DAILY  . ALPRAZolam (XANAX) 0.5 MG tablet TAKE 1/2 TO 1 TABLET BY MOUTH EVERY 8 HOURS AS NEEDED FOR ANXIETY (Patient not taking: Reported on 10/11/2016)  .  fluorouracil (EFUDEX) 5 % cream Apply 1 application topically 2 (two) times daily. Reported on 05/10/2015  . [DISCONTINUED] etonogestrel-ethinyl estradiol (NUVARING) 0.12-0.015 MG/24HR vaginal ring Insert vaginally and leave in place for 3 consecutive weeks, then remove for 1 week. (Patient not taking: Reported on 10/11/2016)   No facility-administered encounter medications on file as of 10/11/2016.    No Known Allergies  ROS: no fever, chills, URI symptoms, headaches, dizziness, cough, shortness of breath, chest pain, palpitations, GI or GU complaints except the irregular menses as noted in HPI.  No urinary complaints, joint pains, bleeding, bruising, rashes.  Moods are good, no insomnia.  +weight gain   PHYSICAL EXAM:  BP 132/80   Pulse (!) 55   Resp 18   Ht '5\' 3"'$  (1.6 m)   Wt 164 lb 3.2 oz (74.5 kg)   LMP 09/25/2016   SpO2 96%   BMI 29.09 kg/m   Wt Readings from Last 3 Encounters:  10/11/16 164 lb 3.2 oz (74.5 kg)  05/10/15 148 lb 12.8 oz (67.5 kg)  10/12/14 144 lb 12.8 oz (65.7 kg)   Well developed, pleasant, overweight female in good spirits HEENT: conjunctiva and sclera are clear EOMI, OP clear Neck: no lymphadenopathy, thyromegaly or mass Heart: regular rate and rhythm, no murmur Lungs: clear bilaterally Back: no spinal or CVA tenderness Abdomen: soft, nontender, no organomegaly or mass Extremities: no edema, normal pulses Psych: normal mood, affect, hygiene and grooming Neuro: alert and oriented, cranial nerves intact, normal strength,  gait  ASSESSMENT/PLAN:  Irregular menses - suspect perimenopausal. Given wt gain, will r/o thyroid disease - Plan: CBC with Differential/Platelet, TSH  Vitamin D deficiency - recheck; unsure of dose in current gummy MVI - Plan: VITAMIN D 25 Hydroxy (Vit-D Deficiency, Fractures)  Anxiety state - improved, doing well on Lexapro, continue  Other fatigue - Plan: Comprehensive metabolic panel, CBC with Differential/Platelet, VITAMIN D 25  Hydroxy (Vit-D Deficiency, Fractures), TSH  Counseled re: causes of irregular menses, typical perimenopause, thyroid symptoms, weight loss, healthy diet, risks of alcohol--encouraged no more than 1/d, preferably less for weight loss  25-30 min visit, more than 1/2 spent counseling.   TSH, Vit D, CBC, c-met Last ate 3 hours ago.    Continue to track your cycles. I suspect perimenopause is contributing to the irregularities. We are checking some other blood tests to evaluate for causes of irregular periods and fatigue.  I'll get your results to you in 1-2 days. Cut back on the alcohol intake to help with weight loss.  Recommendations for women is no more than 1/day. Be sure to get at least 150 minutes of exercise each week (and weight-bearing exercise 2x/week).

## 2016-10-11 ENCOUNTER — Ambulatory Visit (INDEPENDENT_AMBULATORY_CARE_PROVIDER_SITE_OTHER): Payer: 59 | Admitting: Family Medicine

## 2016-10-11 ENCOUNTER — Encounter: Payer: Self-pay | Admitting: Family Medicine

## 2016-10-11 VITALS — BP 132/80 | HR 55 | Resp 18 | Ht 63.0 in | Wt 164.2 lb

## 2016-10-11 DIAGNOSIS — E559 Vitamin D deficiency, unspecified: Secondary | ICD-10-CM

## 2016-10-11 DIAGNOSIS — N926 Irregular menstruation, unspecified: Secondary | ICD-10-CM

## 2016-10-11 DIAGNOSIS — F411 Generalized anxiety disorder: Secondary | ICD-10-CM

## 2016-10-11 DIAGNOSIS — R5383 Other fatigue: Secondary | ICD-10-CM

## 2016-10-11 LAB — CBC WITH DIFFERENTIAL/PLATELET
BASOS PCT: 1 %
Basophils Absolute: 63 cells/uL (ref 0–200)
EOS ABS: 63 {cells}/uL (ref 15–500)
Eosinophils Relative: 1 %
HEMATOCRIT: 39.7 % (ref 35.0–45.0)
HEMOGLOBIN: 13.2 g/dL (ref 11.7–15.5)
LYMPHS ABS: 1764 {cells}/uL (ref 850–3900)
Lymphocytes Relative: 28 %
MCH: 29.6 pg (ref 27.0–33.0)
MCHC: 33.2 g/dL (ref 32.0–36.0)
MCV: 89 fL (ref 80.0–100.0)
MONO ABS: 441 {cells}/uL (ref 200–950)
MPV: 10.1 fL (ref 7.5–12.5)
Monocytes Relative: 7 %
NEUTROS ABS: 3969 {cells}/uL (ref 1500–7800)
Neutrophils Relative %: 63 %
Platelets: 288 10*3/uL (ref 140–400)
RBC: 4.46 MIL/uL (ref 3.80–5.10)
RDW: 13.5 % (ref 11.0–15.0)
WBC: 6.3 10*3/uL (ref 4.0–10.5)

## 2016-10-11 LAB — TSH: TSH: 0.84 m[IU]/L

## 2016-10-11 NOTE — Patient Instructions (Signed)
  Continue to track your cycles. I suspect perimenopause is contributing to the irregularities. We are checking some other blood tests to evaluate for causes of irregular periods and fatigue.  I'll get your results to you in 1-2 days. Cut back on the alcohol intake to help with weight loss.  Recommendations for women is no more than 1/day. Be sure to get at least 150 minutes of exercise each week (and weight-bearing exercise 2x/week).

## 2016-10-12 ENCOUNTER — Encounter: Payer: Self-pay | Admitting: Family Medicine

## 2016-10-12 LAB — COMPREHENSIVE METABOLIC PANEL
ALBUMIN: 4.4 g/dL (ref 3.6–5.1)
ALT: 12 U/L (ref 6–29)
AST: 19 U/L (ref 10–35)
Alkaline Phosphatase: 58 U/L (ref 33–115)
BUN: 13 mg/dL (ref 7–25)
CHLORIDE: 104 mmol/L (ref 98–110)
CO2: 25 mmol/L (ref 20–32)
CREATININE: 0.97 mg/dL (ref 0.50–1.10)
Calcium: 9.2 mg/dL (ref 8.6–10.2)
Glucose, Bld: 84 mg/dL (ref 65–99)
POTASSIUM: 4.3 mmol/L (ref 3.5–5.3)
SODIUM: 139 mmol/L (ref 135–146)
TOTAL PROTEIN: 6.6 g/dL (ref 6.1–8.1)
Total Bilirubin: 0.4 mg/dL (ref 0.2–1.2)

## 2016-10-12 LAB — VITAMIN D 25 HYDROXY (VIT D DEFICIENCY, FRACTURES): Vit D, 25-Hydroxy: 37 ng/mL (ref 30–100)

## 2016-11-18 ENCOUNTER — Other Ambulatory Visit: Payer: Self-pay | Admitting: Family Medicine

## 2016-11-18 DIAGNOSIS — F411 Generalized anxiety disorder: Secondary | ICD-10-CM

## 2016-12-20 ENCOUNTER — Other Ambulatory Visit (INDEPENDENT_AMBULATORY_CARE_PROVIDER_SITE_OTHER): Payer: 59

## 2016-12-20 DIAGNOSIS — Z23 Encounter for immunization: Secondary | ICD-10-CM

## 2017-01-24 ENCOUNTER — Other Ambulatory Visit: Payer: Self-pay | Admitting: Family Medicine

## 2017-01-24 DIAGNOSIS — Z1231 Encounter for screening mammogram for malignant neoplasm of breast: Secondary | ICD-10-CM

## 2017-03-03 ENCOUNTER — Ambulatory Visit
Admission: RE | Admit: 2017-03-03 | Discharge: 2017-03-03 | Disposition: A | Discharge status: Home / Self Care | Payer: 59 | Source: Ambulatory Visit | Attending: Family Medicine | Admitting: Family Medicine

## 2017-03-03 DIAGNOSIS — Z1231 Encounter for screening mammogram for malignant neoplasm of breast: Secondary | ICD-10-CM

## 2017-03-09 ENCOUNTER — Other Ambulatory Visit: Payer: Self-pay | Admitting: Family Medicine

## 2017-03-09 DIAGNOSIS — F411 Generalized anxiety disorder: Secondary | ICD-10-CM
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
## 2017-03-24 ENCOUNTER — Other Ambulatory Visit (HOSPITAL_COMMUNITY)
Admission: RE | Admit: 2017-03-24 | Discharge: 2017-03-24 | Disposition: A | Discharge status: Home / Self Care | Payer: 59 | Source: Ambulatory Visit | Attending: Family Medicine | Admitting: Family Medicine

## 2017-03-24 ENCOUNTER — Ambulatory Visit (INDEPENDENT_AMBULATORY_CARE_PROVIDER_SITE_OTHER): Payer: 59 | Admitting: Family Medicine

## 2017-03-24 ENCOUNTER — Encounter: Payer: Self-pay | Admitting: Family Medicine

## 2017-03-24 VITALS — BP 110/70 | HR 72 | Ht 63.5 in | Wt 166.2 lb

## 2017-03-24 DIAGNOSIS — Z0000 Encounter for general adult medical examination without abnormal findings: Secondary | ICD-10-CM | POA: Insufficient documentation

## 2017-03-24 DIAGNOSIS — F411 Generalized anxiety disorder: Secondary | ICD-10-CM | POA: Diagnosis not present

## 2017-03-24 DIAGNOSIS — R87810 Cervical high risk human papillomavirus (HPV) DNA test positive: Secondary | ICD-10-CM | POA: Insufficient documentation

## 2017-03-24 DIAGNOSIS — E559 Vitamin D deficiency, unspecified: Secondary | ICD-10-CM | POA: Diagnosis not present

## 2017-03-24 DIAGNOSIS — N926 Irregular menstruation, unspecified: Secondary | ICD-10-CM | POA: Diagnosis not present

## 2017-03-24 LAB — POCT URINALYSIS DIP (PROADVANTAGE DEVICE)
BILIRUBIN UA: NEGATIVE mg/dL
Bilirubin, UA: NEGATIVE
GLUCOSE UA: NEGATIVE mg/dL
Leukocytes, UA: NEGATIVE
NITRITE UA: NEGATIVE
Protein Ur, POC: NEGATIVE mg/dL
Specific Gravity, Urine: 1.03
Urobilinogen, Ur: NEGATIVE
pH, UA: 6 (ref 5.0–8.0)

## 2017-03-24 NOTE — Patient Instructions (Addendum)
  HEALTH MAINTENANCE RECOMMENDATIONS:  It is recommended that you get at least 30 minutes of aerobic exercise at least 5 days/week (for weight loss, you may need as much as 60-90 minutes). This can be any activity that gets your heart rate up. This can be divided in 10-15 minute intervals if needed, but try and build up your endurance at least once a week.  Weight bearing exercise is also recommended twice weekly.  Eat a healthy diet with lots of vegetables, fruits and fiber.  "Colorful" foods have a lot of vitamins (ie green vegetables, tomatoes, red peppers, etc).  Limit sweet tea, regular sodas and alcoholic beverages, all of which has a lot of calories and sugar.  Up to 1 alcoholic drink daily may be beneficial for women (unless trying to lose weight, watch sugars).  Drink a lot of water.  Calcium recommendations are 1200-1500 mg daily (1500 mg for postmenopausal women or women without ovaries), and vitamin D 1000 IU daily.  This should be obtained from diet and/or supplements (vitamins), and calcium should not be taken all at once, but in divided doses.  Monthly self breast exams and yearly mammograms for women over the age of 71 is recommended.  Sunscreen of at least SPF 30 should be used on all sun-exposed parts of the skin when outside between the hours of 10 am and 4 pm (not just when at beach or pool, but even with exercise, golf, tennis, and yard work!)  Use a sunscreen that says "broad spectrum" so it covers both UVA and UVB rays, and make sure to reapply every 1-2 hours.  Remember to change the batteries in your smoke detectors when changing your clock times in the spring and fall.  Use your seat belt every time you are in a car, and please drive safely and not be distracted with cell phones and texting while driving.  I recommend getting the new shingles vaccine (Shingrix) once you are 50 years old. You will need to check with your insurance to see if it is covered, and if covered by  Medicare Part D, you need to get from the pharmacy rather than our office.  It is a series of 2 injections, spaced 2 months apart.  Let us know if you need assistance in scheduling your colonoscopy--due at age 24.  You'll need a tetanus booster next year (at your physical).

## 2017-03-27 LAB — CYTOLOGY - PAP
Diagnosis: NEGATIVE
HPV: DETECTED — AB

## 2017-04-29 ENCOUNTER — Encounter: Payer: Self-pay | Admitting: Family Medicine

## 2017-06-12 ENCOUNTER — Other Ambulatory Visit: Payer: Self-pay | Admitting: Family Medicine

## 2017-06-12 DIAGNOSIS — F411 Generalized anxiety disorder: Secondary | ICD-10-CM

## 2018-01-01 ENCOUNTER — Encounter: Payer: Self-pay | Admitting: *Deleted

## 2018-02-12 ENCOUNTER — Encounter: Payer: Self-pay | Admitting: Family Medicine

## 2018-02-12 ENCOUNTER — Other Ambulatory Visit: Payer: Self-pay | Admitting: Family Medicine

## 2018-02-12 MED ORDER — ALPRAZOLAM 0.5 MG PO TABS: 0.2500 mg | ORAL_TABLET | Freq: Three times a day (TID) | ORAL | 0 refills | Status: DC | PRN

## 2018-02-12 NOTE — Telephone Encounter (Signed)
Last prescribed 02/2017, has appt scheduled for 02/2018.  Refilled

## 2018-02-12 NOTE — Telephone Encounter (Signed)
Is this okay to refill? 

## 2018-03-04 ENCOUNTER — Other Ambulatory Visit: Payer: Self-pay | Admitting: Family Medicine

## 2018-03-04 DIAGNOSIS — Z1231 Encounter for screening mammogram for malignant neoplasm of breast: Secondary | ICD-10-CM

## 2018-03-09 ENCOUNTER — Ambulatory Visit
Admission: RE | Admit: 2018-03-09 | Discharge: 2018-03-09 | Disposition: A | Discharge status: Home / Self Care | Payer: 59 | Source: Ambulatory Visit | Attending: Family Medicine | Admitting: Family Medicine

## 2018-03-09 DIAGNOSIS — Z1231 Encounter for screening mammogram for malignant neoplasm of breast: Secondary | ICD-10-CM

## 2018-03-24 DIAGNOSIS — R87810 Cervical high risk human papillomavirus (HPV) DNA test positive: Secondary | ICD-10-CM | POA: Insufficient documentation
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
## 2018-03-24 NOTE — Patient Instructions (Addendum)
HEALTH MAINTENANCE RECOMMENDATIONS:  It is recommended that you get at least 30 minutes of aerobic exercise at least 5 days/week (for weight loss, you may need as much as 60-90 minutes). This can be any activity that gets your heart rate up. This can be divided in 10-15 minute intervals if needed, but try and build up your endurance at least once a week.  Weight bearing exercise is also recommended twice weekly.  Eat a healthy diet with lots of vegetables, fruits and fiber.  "Colorful" foods have a lot of vitamins (ie green vegetables, tomatoes, red peppers, etc).  Limit sweet tea, regular sodas and alcoholic beverages, all of which has a lot of calories and sugar.  Up to 1 alcoholic drink daily may be beneficial for women (unless trying to lose weight, watch sugars).  Drink a lot of water.  Calcium recommendations are 1200-1500 mg daily (1500 mg for postmenopausal women or women without ovaries), and vitamin D 1000 IU daily.  This should be obtained from diet and/or supplements (vitamins), and calcium should not be taken all at once, but in divided doses.  Monthly self breast exams and yearly mammograms for women over the age of 31 is recommended.  Sunscreen of at least SPF 30 should be used on all sun-exposed parts of the skin when outside between the hours of 10 am and 4 pm (not just when at beach or pool, but even with exercise, golf, tennis, and yard work!)  Use a sunscreen that says "broad spectrum" so it covers both UVA and UVB rays, and make sure to reapply every 1-2 hours.  Remember to change the batteries in your smoke detectors when changing your clock times in the spring and fall.  Use your seat belt every time you are in a car, and please drive safely and not be distracted with cell phones and texting while driving.  You are due for colon cancer screening.  I recommend getting the new shingles vaccine (Shingrix). You will need to check with your insurance to see if it is covered, and  if covered, schedule a nurse visit for when the side effects won't be too much of an inconvenience.  It is a series of 2 injections, spaced 2 months apart. Double check that coverage is the same to get at a pharmacy if that would be easier for you.  Check your insurance regarding coverage of Cologuard for screening for colon cancer (versus colonoscopy).  Let me know what you would like done so we do the referral.   Please do the kegel's exercises at leat 50-100 times throughout the day as we discussed.  If you don't notice any improvement in your leakage, next step is referral for physical therapy. Send [place] a message or call if/when needed.  BP was elevated today.  Be sure to recheck elsewhere to ensure that BP doesn't remain high.  BP goal is <120-130/80's. If persistently over 135-140/85-90, please return to discuss. See handout on low sodium diet. Regular exercise and weight loss will also reduce the risk for hypertension.     Low-Sodium Eating Plan Sodium, which is an element that makes up salt, helps you maintain a healthy balance of fluids in your body. Too much sodium can increase your blood pressure and cause fluid and waste to be held in your body. Your health care provider or dietitian may recommend following this plan if you have high blood pressure (hypertension), kidney disease, liver disease, or heart failure. Eating less sodium can help  lower your blood pressure, reduce swelling, and protect your heart, liver, and kidneys. What are tips for following this plan? General guidelines  Most people on this plan should limit their sodium intake to 1,500-2,000 mg (milligrams) of sodium each day. Reading food labels   The Nutrition Facts label lists the amount of sodium in one serving of the food. If you eat more than one serving, you must multiply the listed amount of sodium by the number of servings.  Choose foods with less than 140 mg of sodium per serving.  Avoid foods with 300  mg of sodium or more per serving. Shopping  Look for lower-sodium products, often labeled as "low-sodium" or "no salt added."  Always check the sodium content even if foods are labeled as "unsalted" or "no salt added".  Buy fresh foods. ? Avoid canned foods and premade or frozen meals. ? Avoid canned, cured, or processed meats  Buy breads that have less than 80 mg of sodium per slice. Cooking  Eat more home-cooked food and less restaurant, buffet, and fast food.  Avoid adding salt when cooking. Use salt-free seasonings or herbs instead of table salt or sea salt. Check with your health care provider or pharmacist before using salt substitutes.  Cook with plant-based oils, such as canola, sunflower, or olive oil. Meal planning  When eating at a restaurant, ask that your food be prepared with less salt or no salt, if possible.  Avoid foods that contain MSG (monosodium glutamate). MSG is sometimes added to [place] food, bouillon, and some canned foods. What foods are recommended? The items listed may not be a complete list. Talk with your dietitian about what dietary choices are best for you. Grains Low-sodium cereals, including oats, puffed wheat and rice, and shredded wheat. Low-sodium crackers. Unsalted rice. Unsalted pasta. Low-sodium bread. Whole-grain breads and whole-grain pasta. Vegetables Fresh or frozen vegetables. "No salt added" canned vegetables. "No salt added" tomato sauce and paste. Low-sodium or reduced-sodium tomato and vegetable juice. Fruits Fresh, frozen, or canned fruit. Fruit juice. Meats and other protein foods Fresh or frozen (no salt added) meat, poultry, seafood, and fish. Low-sodium canned tuna and salmon. Unsalted nuts. Dried peas, beans, and lentils without added salt. Unsalted canned beans. Eggs. Unsalted nut butters. Dairy Milk. Soy milk. Cheese that is naturally low in sodium, such as ricotta cheese, fresh mozzarella, or Swiss cheese Low-sodium or  reduced-sodium cheese. Cream cheese. Yogurt. Fats and oils Unsalted butter. Unsalted margarine with no trans fat. Vegetable oils such as canola or olive oils. Seasonings and other foods Fresh and dried herbs and spices. Salt-free seasonings. Low-sodium mustard and ketchup. Sodium-free salad dressing. Sodium-free light mayonnaise. Fresh or refrigerated horseradish. Lemon juice. Vinegar. Homemade, reduced-sodium, or low-sodium soups. Unsalted popcorn and pretzels. Low-salt or salt-free chips. What foods are not recommended? The items listed may not be a complete list. Talk with your dietitian about what dietary choices are best for you. Grains Instant hot cereals. Bread stuffing, pancake, and biscuit mixes. Croutons. Seasoned rice or pasta mixes. Noodle soup cups. Boxed or frozen macaroni and cheese. Regular salted crackers. Self-rising flour. Vegetables Sauerkraut, pickled vegetables, and relishes. Olives. [place] fries. Onion rings. Regular canned vegetables (not low-sodium or reduced-sodium). Regular canned tomato sauce and paste (not low-sodium or reduced-sodium). Regular tomato and vegetable juice (not low-sodium or reduced-sodium). Frozen vegetables in sauces. Meats and other protein foods Meat or fish that is salted, canned, smoked, spiced, or pickled. Bacon, ham, sausage, hotdogs, corned beef, chipped beef, packaged lunch meats,  salt pork, jerky, pickled herring, anchovies, regular canned tuna, sardines, salted nuts. Dairy Processed cheese and cheese spreads. Cheese curds. Blue cheese. Feta cheese. String cheese. Regular cottage cheese. Buttermilk. Canned milk. Fats and oils Salted butter. Regular margarine. Ghee. Bacon fat. Seasonings and other foods Onion salt, garlic salt, seasoned salt, table salt, and sea salt. Canned and packaged gravies. Worcestershire sauce. Tartar sauce. Barbecue sauce. Teriyaki sauce. Soy sauce, including reduced-sodium. Steak sauce. Fish sauce. Oyster sauce.  Cocktail sauce. Horseradish that you find on the shelf. Regular ketchup and mustard. Meat flavorings and tenderizers. Bouillon cubes. Hot sauce and Tabasco sauce. Premade or packaged marinades. Premade or packaged taco seasonings. Relishes. Regular salad dressings. Salsa. Potato and tortilla chips. Corn chips and puffs. Salted popcorn and pretzels. Canned or dried soups. Pizza. Frozen entrees and pot pies. Summary  Eating less sodium can help lower your blood pressure, reduce swelling, and protect your heart, liver, and kidneys.  Most people on this plan should limit their sodium intake to 1,500-2,000 mg (milligrams) of sodium each day.  Canned, boxed, and frozen foods are high in sodium. Restaurant foods, fast foods, and pizza are also very high in sodium. You also get sodium by adding salt to food.  Try to cook at home, eat more fresh fruits and vegetables, and eat less fast food, canned, processed, or prepared foods. This information is not intended to replace advice given to you by your health care provider. Make sure you discuss any questions you have with your health care provider. Document Released: 08/03/2001 Document Revised: 02/05/2016 Document Reviewed: 02/05/2016 Elsevier Interactive Patient Education  2019 [place] from [employer] is an outline of a general healthy diet based on the 2010 Dietary Guidelines for Americans, from the U.[place]. Department of Agriculture Scientist, research (physical sciences)). It sets guidelines for how much food you should eat from each food group based on your age, sex, and level of physical activity. What are tips for following MyPlate? To follow MyPlate recommendations:  Eat a wide variety of fruits and vegetables, grains, and protein foods.  Serve smaller portions and eat less food throughout the day.  Limit portion sizes to avoid overeating.  Enjoy your food.  Get at least 150 minutes of exercise every week. This is about 30 minutes each day, 5 or more days  per week. It can be difficult to have every meal look like MyPlate. Think about MyPlate as eating guidelines for an entire day, rather than each individual meal. Fruits and vegetables  Make half of your plate fruits and vegetables.  Eat many different colors of fruits and vegetables each day.  For a 2,000 calorie daily food plan, eat: ? 2 cups of vegetables every day. ? 2 cups of fruit every day.  1 cup is equal to: ? 1 cup raw or cooked vegetables. ? 1 cup raw fruit. ? 1 medium-sized orange, apple, or banana. ? 1 cup 100% fruit or vegetable juice. ? 2 cups raw leafy greens, such as lettuce, spinach, or kale. ?  cup dried fruit. Grains  One fourth of your plate should be grains.  Make at least half of the grains you eat each day whole grains.  For a 2,000 calorie daily food plan, eat 6 oz of grains every day.  1 oz is equal to: ? 1 slice bread. ? 1 cup cereal. ?  cup cooked rice, cereal, or pasta. Protein  One fourth of your plate should be protein.  Eat a wide variety  of protein foods, including meat, poultry, fish, eggs, beans, nuts, and tofu.  For a 2,000 calorie daily food plan, eat 5 oz of protein every day.  1 oz is equal to: ? 1 oz meat, poultry, or fish. ?  cup cooked beans. ? 1 egg. ?  oz nuts or seeds. ? 1 Tbsp peanut butter. Dairy  Drink fat-free or low-fat (1%) milk.  Eat or drink dairy as a side to meals.  For a 2,000 calorie daily food plan, eat or drink 3 cups of dairy every day.  1 cup is equal to: ? 1 cup milk, yogurt, cottage cheese, or soy milk (soy beverage). ? 2 oz processed cheese. ? 1 oz natural cheese. Fats, oils, salt, and sugars  Only small amounts of oils are recommended.  Avoid foods that are high in calories and low in nutritional value (empty calories), like foods high in fat or added sugars.  Choose foods that are low in salt (sodium). Choose foods that have less than 140 milligrams (mg) of sodium per  serving.  Drink water instead of sugary drinks. Drink enough water each day to keep your urine pale yellow. Where to find support  Work with your health care provider or a nutrition specialist (dietitian) to develop a customized eating plan that is right for you.  Download an app (mobile application) to help you track your daily food intake. Where to find more information  Go to [email or web address] for more information.  Learn more and log your daily food intake according to MyPlate using USDA's SuperTracker: www.supertracker.usda.gov Summary  MyPlate is a general guideline for healthy eating from the USDA. It is based on the 2010 Dietary Guidelines for Americans.  In general, fruits and vegetables should take up  of your plate, grains should take up  of your plate, and protein should take up  of your plate. This information is not intended to replace advice given to you by your health care provider. Make sure you discuss any questions you have with your health care provider. Document Released: 03/03/2007 Document Revised: 05/13/2016 Document Reviewed: 05/13/2016 Elsevier Interactive Patient Education  2019 [employer].

## 2018-03-25 ENCOUNTER — Other Ambulatory Visit (HOSPITAL_COMMUNITY)
Admission: RE | Admit: 2018-03-25 | Discharge: 2018-03-25 | Disposition: A | Discharge status: Home / Self Care | Payer: 59 | Source: Ambulatory Visit | Attending: Family Medicine | Admitting: Family Medicine

## 2018-03-25 ENCOUNTER — Ambulatory Visit: Payer: 59 | Admitting: Family Medicine

## 2018-03-25 ENCOUNTER — Encounter: Payer: Self-pay | Admitting: Family Medicine

## 2018-03-25 VITALS — BP 140/84 | HR 60 | Ht 63.0 in

## 2018-03-25 DIAGNOSIS — F411 Generalized anxiety disorder: Secondary | ICD-10-CM

## 2018-03-25 DIAGNOSIS — Z23 Encounter for immunization: Secondary | ICD-10-CM

## 2018-03-25 DIAGNOSIS — Z0000 Encounter for general adult medical examination without abnormal findings: Secondary | ICD-10-CM | POA: Insufficient documentation

## 2018-03-25 DIAGNOSIS — R030 Elevated blood-pressure reading, without diagnosis of hypertension: Secondary | ICD-10-CM

## 2018-03-25 DIAGNOSIS — Z1211 Encounter for screening for malignant neoplasm of colon: Secondary | ICD-10-CM | POA: Diagnosis not present

## 2018-03-25 DIAGNOSIS — R87810 Cervical high risk human papillomavirus (HPV) DNA test positive: Secondary | ICD-10-CM

## 2018-03-25 DIAGNOSIS — N393 Stress incontinence (female) (male): Secondary | ICD-10-CM

## 2018-03-25 DIAGNOSIS — E559 Vitamin D deficiency, unspecified: Secondary | ICD-10-CM

## 2018-03-25 DIAGNOSIS — R635 Abnormal weight gain: Secondary | ICD-10-CM

## 2018-03-25 LAB — POCT URINALYSIS DIP (PROADVANTAGE DEVICE)
Bilirubin, UA: NEGATIVE
Blood, UA: NEGATIVE
Glucose, UA: NEGATIVE mg/dL
Ketones, POC UA: NEGATIVE mg/dL
LEUKOCYTES UA: NEGATIVE
Nitrite, UA: NEGATIVE
PROTEIN UA: NEGATIVE mg/dL
SPECIFIC GRAVITY, URINE: 1.02
Urobilinogen, Ur: NEGATIVE
pH, UA: 6 (ref 5.0–8.0)

## 2018-03-25 MED ORDER — ESCITALOPRAM OXALATE 10 MG PO TABS: ORAL_TABLET | ORAL | 3 refills | Status: DC

## 2018-03-27 LAB — CYTOLOGY - PAP
Diagnosis: NEGATIVE
HPV (WINDOPATH): NOT DETECTED

## 2018-12-02 ENCOUNTER — Telehealth: Payer: Self-pay | Admitting: *Deleted

## 2018-12-02 NOTE — Telephone Encounter (Signed)
-----   Message from Rita Ohara, MD sent at 12/02/2018  7:24 AM EDT ----- Regarding: labs past due I was notified by epic that they canceled all of her labs ("batch job"), as I guess they had expired.  She had her CPE in January and was supposed to return for fasting labs and never did. She was also supposed to let us know about Cologuard vs refer for colonoscopy, and to check insurance re: shingrix coverage.  Please contact pt to schedule lab visit, remind about colon cancer screening options--I recommend we put in referral for colonoscopy unless she refuses, then we can do Cologuard.  We will need to schedule lab visit and re-order the labs (since they were cancelled, doubt they can be extended)

## 2018-12-02 NOTE — Telephone Encounter (Signed)
Left message asking patient to return my call.

## 2018-12-09 NOTE — Telephone Encounter (Signed)
-----   Message from Rita Ohara, MD sent at 12/02/2018  7:24 AM EDT ----- Regarding: labs past due I was notified by epic that they canceled all of her labs ("batch job"), as I guess they had expired.  She had her CPE in January and was supposed to return for fasting labs and never did. She was also supposed to let us know about Cologuard vs refer for colonoscopy, and to check insurance re: shingrix coverage.  Please contact pt to schedule lab visit, remind about colon cancer screening options--I recommend we put in referral for colonoscopy unless she refuses, then we can do Cologuard.  We will need to schedule lab visit and re-order the labs (since they were cancelled, doubt they can be extended)

## 2018-12-09 NOTE — Telephone Encounter (Signed)
Left another message for patient to please call me back.

## 2018-12-17 NOTE — Telephone Encounter (Signed)
Left another message for patient to return my call.

## 2018-12-23 ENCOUNTER — Encounter: Payer: Self-pay | Admitting: *Deleted

## 2019-01-12 ENCOUNTER — Other Ambulatory Visit: Payer: Self-pay

## 2019-01-12 DIAGNOSIS — Z20822 Contact with and (suspected) exposure to covid-19: Secondary | ICD-10-CM

## 2019-01-14 LAB — NOVEL CORONAVIRUS, NAA: SARS-CoV-2, NAA: NOT DETECTED

## 2019-02-07 DIAGNOSIS — M25531 Pain in right wrist: Secondary | ICD-10-CM | POA: Diagnosis not present

## 2019-02-21 DIAGNOSIS — Z20828 Contact with and (suspected) exposure to other viral communicable diseases: Secondary | ICD-10-CM | POA: Diagnosis not present

## 2019-03-10 ENCOUNTER — Encounter: Payer: Self-pay | Admitting: Family Medicine

## 2019-03-23 ENCOUNTER — Other Ambulatory Visit: Payer: Self-pay | Admitting: Family Medicine

## 2019-03-23 ENCOUNTER — Encounter: Payer: Self-pay | Admitting: Family Medicine

## 2019-03-23 DIAGNOSIS — E559 Vitamin D deficiency, unspecified: Secondary | ICD-10-CM

## 2019-03-23 DIAGNOSIS — Z0000 Encounter for general adult medical examination without abnormal findings: Secondary | ICD-10-CM

## 2019-03-23 DIAGNOSIS — R635 Abnormal weight gain: Secondary | ICD-10-CM

## 2019-03-23 DIAGNOSIS — Z1231 Encounter for screening mammogram for malignant neoplasm of breast: Secondary | ICD-10-CM

## 2019-03-30 ENCOUNTER — Other Ambulatory Visit: Payer: Self-pay | Admitting: Family Medicine

## 2019-03-30 ENCOUNTER — Encounter: Payer: Self-pay | Admitting: Family Medicine

## 2019-03-30 DIAGNOSIS — F411 Generalized anxiety disorder: Secondary | ICD-10-CM

## 2019-03-30 MED ORDER — ESCITALOPRAM OXALATE 10 MG PO TABS: ORAL_TABLET | ORAL | 0 refills | Status: DC

## 2019-03-30 NOTE — Telephone Encounter (Signed)
It looks like you already authorized this refill for a 30 day supply. What is appropriate is for her to get refilled a 90d supply with zero refills. Please change to 90d in place of the 30d that you previously authorized.  No add'l refills.

## 2019-03-30 NOTE — Telephone Encounter (Signed)
Pt has an up coming appt. Is this okay to refill?

## 2019-03-30 NOTE — Telephone Encounter (Signed)
I refilled this due to upcoming appt just to get her in instead of refilling for a 3 month supply. I have resent this in for 90 days

## 2019-04-02 ENCOUNTER — Other Ambulatory Visit: Payer: Self-pay

## 2019-04-02 DIAGNOSIS — Z0000 Encounter for general adult medical examination without abnormal findings: Secondary | ICD-10-CM | POA: Diagnosis not present

## 2019-04-02 DIAGNOSIS — R635 Abnormal weight gain: Secondary | ICD-10-CM

## 2019-04-02 DIAGNOSIS — E559 Vitamin D deficiency, unspecified: Secondary | ICD-10-CM

## 2019-04-03 LAB — COMPREHENSIVE METABOLIC PANEL
ALT: 12 IU/L (ref 0–32)
AST: 14 IU/L (ref 0–40)
Albumin/Globulin Ratio: 2 (ref 1.2–2.2)
Albumin: 4.3 g/dL (ref 3.8–4.9)
Alkaline Phosphatase: 70 IU/L (ref 39–117)
BUN/Creatinine Ratio: 19 (ref 9–23)
BUN: 18 mg/dL (ref 6–24)
Bilirubin Total: 0.3 mg/dL (ref 0.0–1.2)
CO2: 25 mmol/L (ref 20–29)
Calcium: 9.5 mg/dL (ref 8.7–10.2)
Chloride: 103 mmol/L (ref 96–106)
Creatinine, Ser: 0.96 mg/dL (ref 0.57–1.00)
GFR calc Af Amer: 79 mL/min/{1.73_m2} (ref >59)
GFR calc non Af Amer: 69 mL/min/{1.73_m2} (ref >59)
Globulin, Total: 2.2 g/dL (ref 1.5–4.5)
Glucose: 103 mg/dL — ABNORMAL HIGH (ref 65–99)
Potassium: 4.9 mmol/L (ref 3.5–5.2)
Sodium: 141 mmol/L (ref 134–144)
Total Protein: 6.5 g/dL (ref 6.0–8.5)

## 2019-04-03 LAB — CBC WITH DIFFERENTIAL/PLATELET
Basophils Absolute: 0.1 10*3/uL (ref 0.0–0.2)
Basos: 1 %
EOS (ABSOLUTE): 0.1 10*3/uL (ref 0.0–0.4)
Eos: 3 %
Hematocrit: 38.4 % (ref 34.0–46.6)
Hemoglobin: 12.9 g/dL (ref 11.1–15.9)
Immature Grans (Abs): 0 10*3/uL (ref 0.0–0.1)
Immature Granulocytes: 0 %
Lymphocytes Absolute: 1.5 10*3/uL (ref 0.7–3.1)
Lymphs: 35 %
MCH: 29.5 pg (ref 26.6–33.0)
MCHC: 33.6 g/dL (ref 31.5–35.7)
MCV: 88 fL (ref 79–97)
Monocytes Absolute: 0.4 10*3/uL (ref 0.1–0.9)
Monocytes: 9 %
Neutrophils Absolute: 2.3 10*3/uL (ref 1.4–7.0)
Neutrophils: 52 %
Platelets: 272 10*3/uL (ref 150–450)
RBC: 4.38 x10E6/uL (ref 3.77–5.28)
RDW: 13 % (ref 11.7–15.4)
WBC: 4.4 10*3/uL (ref 3.4–10.8)

## 2019-04-03 LAB — TSH: TSH: 1 u[IU]/mL (ref 0.450–4.500)

## 2019-04-03 LAB — LIPID PANEL
Chol/HDL Ratio: 2.9 ratio (ref 0.0–4.4)
Cholesterol, Total: 188 mg/dL (ref 100–199)
HDL: 65 mg/dL (ref >39)
LDL Chol Calc (NIH): 104 mg/dL — ABNORMAL HIGH (ref 0–99)
Triglycerides: 108 mg/dL (ref 0–149)
VLDL Cholesterol Cal: 19 mg/dL (ref 5–40)

## 2019-04-03 LAB — VITAMIN D 25 HYDROXY (VIT D DEFICIENCY, FRACTURES): Vit D, 25-Hydroxy: 28.6 ng/mL — ABNORMAL LOW (ref 30.0–100.0)

## 2019-04-17 DIAGNOSIS — E559 Vitamin D deficiency, unspecified: Secondary | ICD-10-CM | POA: Insufficient documentation

## 2019-04-17 NOTE — Progress Notes (Signed)
Chief Complaint  Patient presents with  . Annual Exam    nonfasting annual exam with pap. Has had a tendonitis flare and is taking diclofenac prn.     Erin Porter is a 54 y.o. female who presents for a complete physical.  She had labs done prior to today's visit, see below. She has the following concerns:  Tendonitis R wrist.  Flared after doing yoga, no other change in activity.  She has been taking the diclofenac and is much improved.  She would like to continue to do yoga.  She had COVID 12/26, fully recovered.  Denies any ongoing complaints, smell has returned.  Her mother had also been very ill, hospitalized with PE, COVID, and is now also doing much better. She also has been diagnosed with atrial fibrillation.  Last year she reported leakage of urine with cough, sneeze, laughing. Admitted to only doing Kegel's sporadically.  This has improved, since cutting back on caffeine and wine.   She is still having periods, though are less regular--sometimes will vary between 2 weeks and 2 months, usually 30-40 days apart.  She denies hot flashes or night sweats.  She continues to see dermatologist regularly. She went this morning.  Has a few spots she will be treating with 5FU.  Anxiety: Taking lexaprowithout side effects. She had stopped it for a few months in the past, restarted it in 07/2016 when she had recurrent symptoms ("edgy"/irritable, low energy, decreased motivation); feels like she deals with things better when on the Lexapro. She continues to do well, happily re-married, working well with her ex-husband. Prefers to stay on the medication.  Last refilled alprazolam #30 in 01/2018.  She had used it in the past up to 2x/week, usually if she woke up in the middle of the night and had trouble getting back to sleep.  Most recently, she has been taking a supplement nightly for the last 2-3 weeks which has been effective.  It is called "Sleep3", contains valerian root, lavendar,  melatonin.  Able to get back to sleep easier now, if awoken (husband's snoring wakes her).  H/oVitamin D deficiency: Level was 26 on 07/04/14. Normal with level of 37 in 09/2016. She is currently taking a MVI daily, went for a while without taking it when she was at her mom's house out of town. She has been taking it regularly since returning home.   BP was borderline/elevated at last visit. She reports the conversation (related to her weight) was very stressful and she had anticipated it.  She rechecked it when she got home and it was normal, and normal when she was at the dentist. BP Readings from Last 3 Encounters:  03/25/18 140/84  03/24/17 110/70  10/11/16 132/80   After seeing her mom so sick (also had high sugars/DM, possibly related to steroids/treatments she received), she is more motivated to try and get her health in check--by eating healthier, getting regular exercise, and losing a little weight.  She reports she has already lost 12# since making some changes.  Immunization History  Administered Date(s) Administered  . Influenza Split 11/03/2008, 12/05/2010, 11/15/2011  . Influenza,inj,Quad PF,6+ Mos 11/17/2012, 03/01/2016, 12/20/2016  . Influenza-Unspecified 12/27/2017, 01/01/2019  . Tdap 11/03/2008, 03/25/2018   Last Pap smear: 02/2018--normal, no high risk HPV (prior pap was 02/2017--normal, but +HR HPV noted) Last mammogram: 03/09/18, normal; scheduled for next month Last colonoscopy: never (COVID happened before she was able to schedule an appointment, discussed last year) Last DEXA: never Dentist: twice  yearly Ophtho: yearly Sees dermatologist yearly Exercise: Walked regularly until she had COVID--5-6 miles prior to illness; She is now walking up to 3 miles now, 3x/week, and 1.5 miles on the other days (with the dogs).   PMH, PSH, SH and FH reviewed and updated  Outpatient Encounter Medications as of 04/19/2019  Medication Sig Note  . diclofenac (VOLTAREN) 50 MG EC  tablet Take 50 mg by mouth 2 (two) times daily as needed. 04/19/2019: Using recently for wrist pain  . Doxylamine Succinate, Sleep, (SLEEP AID PO) Take by mouth. 04/19/2019: Nature's Bounty Sleep 3-melatonin 10mg ,L-Theanine 200mg , Nightime herbal Blend.   . escitalopram (LEXAPRO) 10 MG tablet TAKE 1 TABLET BY MOUTH DAILY   . fluorouracil (EFUDEX) 5 % cream Apply 1 application topically 2 (two) times daily. Reported on 05/10/2015   . Multiple Vitamins-Minerals (MULTIVITAMIN GUMMIES WOMENS) CHEW Chew 2 each by mouth daily. 04/19/2019: VitaFusion Women's Supercharged Gummy  . ALPRAZolam (XANAX) 0.5 MG tablet Take 0.5-1 tablets (0.25-0.5 mg total) by mouth 3 (three) times daily as needed for anxiety. (Patient not taking: Reported on 04/19/2019) 03/25/2018: Uses prn, about 2x/month (if she wakes up in the middle of the night).  . [DISCONTINUED] escitalopram (LEXAPRO) 10 MG tablet TAKE 1 TABLET(10 MG) BY MOUTH DAILY   . [DISCONTINUED] Multiple Vitamins-Minerals (MULTIVITAMIN WITH MINERALS) tablet Take 1 tablet by mouth daily.    No facility-administered encounter medications on file as of 04/19/2019.   No Known Allergies  ROS: The patient denies anorexia, fever, headaches, vision changes, decreased hearing, ear pain, sore throat, breast concerns, chest pain, palpitations, dizziness, syncope, dyspnea on exertion, cough, swelling, nausea, vomiting, diarrhea, constipation, abdominal pain, melena, hematochezia, indigestion/heartburn, hematuria, dysuria, vaginal discharge, odor or itch, genital lesions, joint pains (just wrist, which is improving with NSAID), numbness, tingling, weakness, tremor, rashes, depression, abnormal bleeding/bruising, or enlarged lymph nodes. Moods are good, anxiety is well controlled. Stress urinary incontinence is less often/improved. Some irregular menses.  No hot flashes or night sweats. Overall weight is up from last year, but she reports she recently lost 12#   PHYSICAL EXAM:  BP  120/76   Pulse 80   Temp 98.4 F (36.9 C) (Other (Comment))   Ht 5' 2.75" (1.594 m)   Wt 175 lb 9.6 oz (79.7 kg)   LMP 03/07/2019   BMI 31.35 kg/m   Wt Readings from Last 3 Encounters:  04/19/19 175 lb 9.6 oz (79.7 kg)  03/24/17 166 lb 3.2 oz (75.4 kg)  10/11/16 164 lb 3.2 oz (74.5 kg)    General Appearance:  Alert, cooperative, no distress, appears stated age  Head:  Normocephalic, without obvious abnormality, atraumatic  Eyes:  PERRL, conjunctiva/corneas clear, EOM's intact, fundi benign  Ears:  Normal TM's and external ear canals  Nose: Not examined, wearing mask due to COVID-19 pandemic  Throat:   Not examined, wearing mask due to COVID-19 pandemic  Neck: Supple, no lymphadenopathy; thyroid: noenlargement/tenderness/ nodules; no carotid bruit or JVD  Back:  Spine nontender, no curvature, ROM normal, no CVAtenderness  Lungs:  Clear to auscultation bilaterally without wheezes, rales orronchi; respirations unlabored  Chest Wall:  No tenderness or deformity  Heart:  Regular rate and rhythm, S1 and S2 normal, no murmur, rub or gallop  Breast Exam:  No tenderness, masses, or nipple discharge or inversion. No axillary lymphadenopathy  Abdomen:  Soft, non-tender, nondistended, normoactive bowel sounds, no masses, no hepatosplenomegaly  Genitalia:  Normal external genitalia without lesions. BUS and vagina normal; no cervical motion tenderness. No  abnormal vaginal discharge. Uterus and adnexa not enlarged, nontender, no masses. Pap not performed  Rectal:  Normal tone, no masses or tenderness; guaiac negative stool  Extremities: No clubbing, cyanosis or edema  Pulses: 2+ and symmetric all extremities  Skin: Skin color, texture, turgor normal, no lesions (full exam not performed, as she just saw derm)  Lymph nodes: Cervical, supraclavicular, and axillary nodes normal  Neurologic: Normal strength, sensation and gait; reflexes 2+ and  symmetric throughout  Psych: Normal mood, affect, hygiene and grooming.     Chemistry      Component Value Date/Time   NA 141 04/02/2019 0831   K 4.9 04/02/2019 0831   CL 103 04/02/2019 0831   CO2 25 04/02/2019 0831   BUN 18 04/02/2019 0831   CREATININE 0.96 04/02/2019 0831   CREATININE 0.97 10/11/2016 1108      Component Value Date/Time   CALCIUM 9.5 04/02/2019 0831   ALKPHOS 70 04/02/2019 0831   AST 14 04/02/2019 0831   ALT 12 04/02/2019 0831   BILITOT 0.3 04/02/2019 0831     Fasting glucose 103  Vitamin D-OH 28.6  Lab Results  Component Value Date   WBC 4.4 04/02/2019   HGB 12.9 04/02/2019   HCT 38.4 04/02/2019   MCV 88 04/02/2019   PLT 272 04/02/2019   Lab Results  Component Value Date   CHOL 188 04/02/2019   HDL 65 04/02/2019   LDLCALC 104 (H) 04/02/2019   TRIG 108 04/02/2019   CHOLHDL 2.9 04/02/2019   Lab Results  Component Value Date   TSH 1.000 04/02/2019    ASSESSMENT/PLAN:  Annual physical exam  Vitamin D deficiency - encouraged to take additional 1000 IU daily through the end of March (and each winter); cont daily MVI  Colon cancer screening - discussed colonoscopy and Cologuard, prefers colonoscopy.   - Plan: Ambulatory referral to Gastroenterology  Impaired fasting glucose - counseled re: pre-diabetes, proper diet, exercise and weight loss to prevent diabetes - Plan: HgB A1c  Generalized anxiety disorder - doing well on lexapro, continue - Plan: escitalopram (LEXAPRO) 10 MG tablet  BMI 31.0-31.9,adult - counseled re: risks of obesity, weight loss encouraged  Insomnia, unspecified type - trouble getting back to sleep--counseled re: alcohol, and her OTC med, some potential risk from long-term valerian root   Discussed monthly self breast exams and yearly mammograms; at least 30 minutes of aerobic activity at least 5 days/week, weight-bearing exercise at least 2x/week; proper sunscreen use reviewed; healthy  diet, including goals of calcium and vitamin D intake and alcohol recommendations (less than or equal to 1 drink/day) reviewed; regular seatbelt use; changing batteries in smoke detectors. Immunization recommendations discussed--continue yearly flu shots. Shingrix recommended,  risks/side effects reviewed, to check insurance and schedule NV when convenient.  COVID vaccine recommended, timing issues discussed. Colon cancer screening recommendations reviewed--past due.  Reviewed options including colonoscopy and Cologuard, risks/benefits. Referred to GI for colonoscopy. Repeat pap next year.   Take an extra 1000 IU of D3 daily through the end of March, and take it October through March every year (when getting less from the son).  Continue your daily multivitamin.  F/u 1 year, sooner prn

## 2019-04-17 NOTE — Patient Instructions (Signed)
HEALTH MAINTENANCE RECOMMENDATIONS:  It is recommended that you get at least 30 minutes of aerobic exercise at least 5 days/week (for weight loss, you may need as much as 60-90 minutes). This can be any activity that gets your heart rate up. This can be divided in 10-15 minute intervals if needed, but try and build up your endurance at least once a week.  Weight bearing exercise is also recommended twice weekly.  Eat a healthy diet with lots of vegetables, fruits and fiber.  "Colorful" foods have a lot of vitamins (ie green vegetables, tomatoes, red peppers, etc).  Limit sweet tea, regular sodas and alcoholic beverages, all of which has a lot of calories and sugar.  Up to 1 alcoholic drink daily may be beneficial for women (unless trying to lose weight, watch sugars).  Drink a lot of water.  Calcium recommendations are 1200-1500 mg daily (1500 mg for postmenopausal women or women without ovaries), and vitamin D 1000 IU daily.  This should be obtained from diet and/or supplements (vitamins), and calcium should not be taken all at once, but in divided doses.  Monthly self breast exams and yearly mammograms for women over the age of 4 is recommended.  Sunscreen of at least SPF 30 should be used on all sun-exposed parts of the skin when outside between the hours of 10 am and 4 pm (not just when at beach or pool, but even with exercise, golf, tennis, and yard work!)  Use a sunscreen that says "broad spectrum" so it covers both UVA and UVB rays, and make sure to reapply every 1-2 hours.  Remember to change the batteries in your smoke detectors when changing your clock times in the spring and fall. Carbon monoxide detectors are recommended for your home.  Use your seat belt every time you are in a car, and please drive safely and not be distracted with cell phones and texting while driving.  I recommend getting the new shingles vaccine (Shingrix). You will need to check with your insurance to see if it  is covered, and if covered, schedule a nurse visit when convenient.  It is a series of 2 injections, spaced 2 months apart.  COVID vaccine is also recommended.  You cannot have any vaccine within 2 weeks of getting the first COVID vaccine.  You should wait 4 weeks after any vaccine before getting the shingles vaccine. Keep this timing in mind.  You are due for colon cancer screening.  We discussed both colonoscopy and Cologuard as screening options.  Vitamin D was a little low.  Taking an additional 1000 IU of vitamin D3 daily is recommended.   Prediabetes Eating Plan Prediabetes is a condition that causes blood sugar (glucose) levels to be higher than normal. This increases the risk for developing diabetes. In order to prevent diabetes from developing, your health care provider may recommend a diet and other lifestyle changes to help you:  Control your blood glucose levels.  Improve your cholesterol levels.  Manage your blood pressure. Your health care provider may recommend working with a diet and nutrition specialist (dietitian) to make a meal plan that is best for you. What are tips for following this plan? Lifestyle  Set weight loss goals with the help of your health care team. It is recommended that most people with prediabetes lose 7% of their current body weight.  Exercise for at least 30 minutes at least 5 days a week.  Attend a support group or seek ongoing support from  a Licensed conveyancer.  Take over-the-counter and prescription medicines only as told by your health care provider. Reading food labels  Read food labels to check the amount of fat, salt (sodium), and sugar in prepackaged foods. Avoid foods that have: ? Saturated fats. ? Trans fats. ? Added sugars.  Avoid foods that have more than 300 milligrams (mg) of sodium per serving. Limit your daily sodium intake to less than 2,300 mg each day. Shopping  Avoid buying pre-made and processed  foods. Cooking  Cook with olive oil. Do not use butter, lard, or ghee.  Bake, broil, grill, or boil foods. Avoid frying. Meal planning   Work with your dietitian to develop an eating plan that is right for you. This may include: ? Tracking how many calories you take in. Use a food diary, notebook, or mobile application to track what you eat at each meal. ? Using the glycemic index (GI) to plan your meals. The index tells you how quickly a food will raise your blood glucose. Choose low-GI foods. These foods take a longer time to raise blood glucose.  Consider following a Mediterranean diet. This diet includes: ? Several servings each day of fresh fruits and vegetables. ? Eating fish at least twice a week. ? Several servings each day of whole grains, beans, nuts, and seeds. ? Using olive oil instead of other fats. ? Moderate alcohol consumption. ? Eating small amounts of red meat and whole-fat dairy.  If you have high blood pressure, you may need to limit your sodium intake or follow a diet such as the DASH eating plan. DASH is an eating plan that aims to lower high blood pressure. What foods are recommended? The items listed below may not be a complete list. Talk with your dietitian about what dietary choices are best for you. Grains Whole grains, such as whole-wheat or whole-grain breads, crackers, cereals, and pasta. Unsweetened oatmeal. Bulgur. Barley. Quinoa. Brown rice. Corn or whole-wheat flour tortillas or taco shells. Vegetables Lettuce. Spinach. Peas. Beets. Cauliflower. Cabbage. Broccoli. Carrots. Tomatoes. Squash. Eggplant. Herbs. Peppers. Onions. Cucumbers. Brussels sprouts. Fruits Berries. Bananas. Apples. Oranges. Grapes. Papaya. Mango. Pomegranate. Kiwi. Grapefruit. Cherries. Meats and other protein foods Seafood. Poultry without skin. Lean cuts of pork and beef. Tofu. Eggs. Nuts. Beans. Dairy Low-fat or fat-free dairy products, such as yogurt, cottage cheese, and  cheese. Beverages Water. Tea. Coffee. Sugar-free or diet soda. Seltzer water. Lowfat or no-fat milk. Milk alternatives, such as soy or almond milk. Fats and oils Olive oil. Canola oil. Sunflower oil. Grapeseed oil. Avocado. Walnuts. Sweets and desserts Sugar-free or low-fat pudding. Sugar-free or low-fat ice cream and other frozen treats. Seasoning and other foods Herbs. Sodium-free spices. Mustard. Relish. Low-fat, low-sugar ketchup. Low-fat, low-sugar barbecue sauce. Low-fat or fat-free mayonnaise. What foods are not recommended? The items listed below may not be a complete list. Talk with your dietitian about what dietary choices are best for you. Grains Refined white flour and flour products, such as bread, pasta, snack foods, and cereals. Vegetables Canned vegetables. Frozen vegetables with butter or cream sauce. Fruits Fruits canned with syrup. Meats and other protein foods Fatty cuts of meat. Poultry with skin. Breaded or fried meat. Processed meats. Dairy Full-fat yogurt, cheese, or milk. Beverages Sweetened drinks, such as sweet iced tea and soda. Fats and oils Butter. Lard. Ghee. Sweets and desserts Baked goods, such as cake, cupcakes, pastries, cookies, and cheesecake. Seasoning and other foods Spice mixes with added salt. Ketchup. Barbecue sauce. Mayonnaise. Summary  To prevent diabetes from developing, you may need to make diet and other lifestyle changes to help control blood sugar, improve cholesterol levels, and manage your blood pressure.  Set weight loss goals with the help of your health care team. It is recommended that most people with prediabetes lose 7 percent of their current body weight.  Consider following a Mediterranean diet that includes plenty of fresh fruits and vegetables, whole grains, beans, nuts, seeds, fish, lean meat, low-fat dairy, and healthy oils. This information is not intended to replace advice given to you by your health care provider.  Make sure you discuss any questions you have with your health care provider. Document Revised: 06/05/2018 Document Reviewed: 04/17/2016 Elsevier Patient Education  2020 [employer].

## 2019-04-19 ENCOUNTER — Ambulatory Visit (INDEPENDENT_AMBULATORY_CARE_PROVIDER_SITE_OTHER): Payer: BC Managed Care – PPO | Admitting: Family Medicine

## 2019-04-19 ENCOUNTER — Other Ambulatory Visit: Payer: Self-pay

## 2019-04-19 ENCOUNTER — Encounter: Payer: Self-pay | Admitting: Family Medicine

## 2019-04-19 VITALS — BP 120/76 | HR 80 | Temp 98.4°F | Ht 62.75 in | Wt 175.6 lb

## 2019-04-19 DIAGNOSIS — Z0000 Encounter for general adult medical examination without abnormal findings: Secondary | ICD-10-CM

## 2019-04-19 DIAGNOSIS — G4700 Insomnia, unspecified: Secondary | ICD-10-CM

## 2019-04-19 DIAGNOSIS — R7301 Impaired fasting glucose: Secondary | ICD-10-CM | POA: Diagnosis not present

## 2019-04-19 DIAGNOSIS — D2261 Melanocytic nevi of right upper limb, including shoulder: Secondary | ICD-10-CM | POA: Diagnosis not present

## 2019-04-19 DIAGNOSIS — D2262 Melanocytic nevi of left upper limb, including shoulder: Secondary | ICD-10-CM | POA: Diagnosis not present

## 2019-04-19 DIAGNOSIS — Z85828 Personal history of other malignant neoplasm of skin: Secondary | ICD-10-CM | POA: Diagnosis not present

## 2019-04-19 DIAGNOSIS — F411 Generalized anxiety disorder: Secondary | ICD-10-CM

## 2019-04-19 DIAGNOSIS — L814 Other melanin hyperpigmentation: Secondary | ICD-10-CM | POA: Diagnosis not present

## 2019-04-19 DIAGNOSIS — E559 Vitamin D deficiency, unspecified: Secondary | ICD-10-CM | POA: Diagnosis not present

## 2019-04-19 DIAGNOSIS — Z1211 Encounter for screening for malignant neoplasm of colon: Secondary | ICD-10-CM | POA: Diagnosis not present

## 2019-04-19 DIAGNOSIS — Z6831 Body mass index (BMI) 31.0-31.9, adult: Secondary | ICD-10-CM

## 2019-04-19 LAB — POCT GLYCOSYLATED HEMOGLOBIN (HGB A1C): Hemoglobin A1C: 5.6 % (ref 4.0–5.6)

## 2019-04-19 MED ORDER — ESCITALOPRAM OXALATE 10 MG PO TABS: ORAL_TABLET | ORAL | 3 refills | Status: DC

## 2019-04-29 ENCOUNTER — Other Ambulatory Visit: Payer: Self-pay

## 2019-04-29 ENCOUNTER — Encounter: Payer: Self-pay | Admitting: Family Medicine

## 2019-04-29 ENCOUNTER — Ambulatory Visit
Admission: RE | Admit: 2019-04-29 | Discharge: 2019-04-29 | Disposition: A | Discharge status: Home / Self Care | Payer: BC Managed Care – PPO | Source: Ambulatory Visit | Attending: Family Medicine | Admitting: Family Medicine

## 2019-04-29 DIAGNOSIS — Z1231 Encounter for screening mammogram for malignant neoplasm of breast: Secondary | ICD-10-CM

## 2019-06-04 ENCOUNTER — Encounter: Payer: Self-pay | Admitting: Gastroenterology

## 2019-07-09 ENCOUNTER — Other Ambulatory Visit: Payer: Self-pay

## 2019-07-09 ENCOUNTER — Ambulatory Visit (AMBULATORY_SURGERY_CENTER): Payer: Self-pay | Admitting: *Deleted

## 2019-07-09 VITALS — Temp 97.1°F | Ht 62.75 in | Wt 173.0 lb

## 2019-07-09 DIAGNOSIS — Z1211 Encounter for screening for malignant neoplasm of colon: Secondary | ICD-10-CM

## 2019-07-09 MED ORDER — SUTAB 1479-225-188 MG PO TABS: 1.0000 | ORAL_TABLET | ORAL | 0 refills | Status: DC

## 2019-07-09 NOTE — Progress Notes (Signed)
Patient is here in-person for PV. Patient denies any allergies to eggs or soy. Patient denies any problems with anesthesia/sedation. Patient denies any oxygen use at home. Patient denies taking any diet/weight loss medications or blood thinners. Patient is not being treated for MRSA or C-diff. Patient is aware of our care-partner policy and 0000000 safety protocol. EMMI education assisgned to the patient for the procedure, this was explained and instructions given to patient.   Completed covid vaccines on 06/11/19.  Prep Prescription coupon given to the patient.

## 2019-07-13 ENCOUNTER — Encounter: Payer: Self-pay | Admitting: Gastroenterology

## 2019-07-23 ENCOUNTER — Other Ambulatory Visit: Payer: Self-pay

## 2019-07-23 ENCOUNTER — Encounter: Payer: Self-pay | Admitting: Gastroenterology

## 2019-07-23 ENCOUNTER — Ambulatory Visit (AMBULATORY_SURGERY_CENTER): Payer: BC Managed Care – PPO | Admitting: Gastroenterology

## 2019-07-23 VITALS — BP 113/70 | HR 59 | Temp 96.8°F | Resp 16 | Ht 62.0 in | Wt 173.0 lb

## 2019-07-23 DIAGNOSIS — K635 Polyp of colon: Secondary | ICD-10-CM | POA: Diagnosis not present

## 2019-07-23 DIAGNOSIS — Z1211 Encounter for screening for malignant neoplasm of colon: Secondary | ICD-10-CM

## 2019-07-23 DIAGNOSIS — D123 Benign neoplasm of transverse colon: Secondary | ICD-10-CM

## 2019-07-23 DIAGNOSIS — D120 Benign neoplasm of cecum: Secondary | ICD-10-CM | POA: Diagnosis not present

## 2019-07-23 MED ORDER — SODIUM CHLORIDE 0.9 % IV SOLN: 500.0000 mL | Freq: Once | INTRAVENOUS | Status: DC

## 2019-07-23 NOTE — Patient Instructions (Signed)
° °Handouts on polyps & hemorrhoids given to you today  ° °Await pathology results on polyps removed  ° ° ° °YOU HAD AN ENDOSCOPIC PROCEDURE TODAY AT THE [facility]:   Refer to the procedure report that was given to you for any specific questions about what was found during the examination.  If the procedure report does not answer your questions, please call your gastroenterologist to clarify.  If you requested that your care partner not be given the details of your procedure findings, then the procedure report has been included in a sealed envelope for you to review at your convenience later. ° °YOU SHOULD EXPECT: Some feelings of bloating in the abdomen. Passage of more gas than usual.  Walking can help get rid of the air that was put into your GI tract during the procedure and reduce the bloating. If you had a lower endoscopy (such as a colonoscopy or flexible sigmoidoscopy) you may notice spotting of blood in your stool or on the toilet paper. If you underwent a bowel prep for your procedure, you may not have a normal bowel movement for a few days. ° °Please Note:  You might notice some irritation and congestion in your nose or some drainage.  This is from the oxygen used during your procedure.  There is no need for concern and it should clear up in a day or so. ° °SYMPTOMS TO REPORT IMMEDIATELY: ° °Following lower endoscopy (colonoscopy or flexible sigmoidoscopy): ° Excessive amounts of blood in the stool ° Significant tenderness or worsening of abdominal pains ° Swelling of the abdomen that is new, acute ° Fever of 100°F or higher ° ° °For urgent or emergent issues, a gastroenterologist can be reached at any hour by calling [phone]. °Do not use MyChart messaging for urgent concerns.  ° ° °DIET:  We do recommend a small meal at first, but then you may proceed to your regular diet.  Drink plenty of fluids but you should avoid alcoholic beverages for 24 hours. ° °ACTIVITY:  You should plan  to take it easy for the rest of today and you should NOT DRIVE or use heavy machinery until tomorrow (because of the sedation medicines used during the test).   ° °FOLLOW UP: °Our staff will call the number listed on your records 48-72 hours following your procedure to check on you and address any questions or concerns that you may have regarding the information given to you following your procedure. If we do not reach you, we will leave a message.  We will attempt to reach you two times.  During this call, we will ask if you have developed any symptoms of COVID 19. If you develop any symptoms (ie: fever, flu-like symptoms, shortness of breath, cough etc.) before then, please call [phone].  If you test positive for Covid 19 in the 2 weeks post procedure, please call and report this information to us.   ° °If any biopsies were taken you will be contacted by phone or by letter within the next 1-3 weeks.  Please call us at [phone] if you have not heard about the biopsies in 3 weeks.  ° ° °SIGNATURES/CONFIDENTIALITY: °You and/or your care partner have signed paperwork which will be entered into your electronic medical record.  These signatures attest to the fact that that the information above on your After Visit Summary has been reviewed and is understood.  Full responsibility of the confidentiality of this discharge information lies with you   and/or your care-partner. 

## 2019-07-23 NOTE — Op Note (Signed)
[place] Patient Name: Erin Porter Procedure Date: 07/23/2019 7:59 AM MRN: [id number] Endoscopist: Mauri Pole , MD Age: 54 Referring MD:  Date of Birth: [birth year] Gender: Female Account #: [id number] Procedure:                Colonoscopy Indications:              Screening for colorectal malignant neoplasm Medicines:                Monitored Anesthesia Care Procedure:                Pre-Anesthesia Assessment:                           - Prior to the procedure, a History and Physical                            was performed, and patient medications and                            allergies were reviewed. The patient's tolerance of                            previous anesthesia was also reviewed. The risks                            and benefits of the procedure and the sedation                            options and risks were discussed with the patient.                            All questions were answered, and informed consent                            was obtained. Prior Anticoagulants: The patient has                            taken no previous anticoagulant or antiplatelet                            agents. ASA Grade Assessment: II - A patient with                            mild systemic disease. After reviewing the risks                            and benefits, the patient was deemed in                            satisfactory condition to undergo the procedure.                           After obtaining informed consent, the colonoscope  was passed under direct vision. Throughout the                            procedure, the patient's blood pressure, pulse, and                            oxygen saturations were monitored continuously. The                            Colonoscope was introduced through the anus and                            advanced to the the cecum, identified by                            appendiceal orifice  and ileocecal valve. The                            colonoscopy was performed without difficulty. The                            patient tolerated the procedure well. The quality                            of the bowel preparation was excellent. The                            ileocecal valve, appendiceal orifice, and rectum                            were photographed. Scope In: 8:18:22 AM Scope Out: 8:28:31 AM Scope Withdrawal Time: 0 hours 8 minutes 3 seconds  Total Procedure Duration: 0 hours 10 minutes 9 seconds  Findings:                 The perianal and digital rectal examinations were                            normal.                           A 11 mm polyp was found in the cecum. The polyp was                            sessile. The polyp was removed with a cold snare.                            Resection and retrieval were complete.                           A less than 1 mm polyp was found in the transverse                            colon. The polyp was sessile. The polyp was removed  with a cold biopsy forceps. Resection and retrieval                            were complete.                           Non-bleeding internal hemorrhoids were found during                            retroflexion. The hemorrhoids were small.                           The exam was otherwise without abnormality. Complications:            No immediate complications. Estimated Blood Loss:     Estimated blood loss was minimal. Impression:               - One 11 mm polyp in the cecum, removed with a cold                            snare. Resected and retrieved.                           - One less than 1 mm polyp in the transverse colon,                            removed with a cold biopsy forceps. Resected and                            retrieved.                           - Non-bleeding internal hemorrhoids.                           - The examination was otherwise  normal. Recommendation:           - Patient has a contact number available for                            emergencies. The signs and symptoms of potential                            delayed complications were discussed with the                            patient. Return to normal activities tomorrow.                            Written discharge instructions were provided to the                            patient.                           - Resume previous diet.                           -  Continue present medications.                           - Await pathology results.                           - Repeat colonoscopy in 3 - 5 years for                            surveillance based on pathology results. Mauri Pole, MD 07/23/2019 8:33:46 AM This report has been signed electronically.

## 2019-07-23 NOTE — Progress Notes (Signed)
Pt's states no medical or surgical changes since previsit or office visit.  BC Iv and KA vitals.

## 2019-07-23 NOTE — Progress Notes (Signed)
Called to room to assist during endoscopic procedure.  Patient ID and intended procedure confirmed with present staff. Received instructions for my participation in the procedure from the performing physician.  

## 2019-07-23 NOTE — Progress Notes (Signed)
A/ox3, pleased with MAC, report to RN 

## 2019-07-27 ENCOUNTER — Telehealth: Payer: Self-pay

## 2019-07-27 NOTE — Telephone Encounter (Signed)
  Follow up Call-  Call back number 07/23/2019  Post procedure Call Back phone  # [phone]  Permission to leave phone message Yes  Some recent data might be hidden     Patient questions:  Do you have a fever, pain , or abdominal swelling? No. Pain Score  0 *  Have you tolerated food without any problems? Yes.    Have you been able to return to your normal activities? Yes.    Do you have any questions about your discharge instructions: Diet   No. Medications  No. Follow up visit  No.  Do you have questions or concerns about your Care? No.  Actions: * If pain score is 4 or above: No action needed, pain <4.   1. Have you developed a fever since your procedure? no  2.   Have you had an respiratory symptoms (SOB or cough) since your procedure? no  3.   Have you tested positive for COVID 19 since your procedure no  4.   Have you had any family members/close contacts diagnosed with the COVID 19 since your procedure?  no   If yes to any of these questions please route to Joylene John, RN and Erenest Rasher, RN

## 2019-07-27 NOTE — Telephone Encounter (Signed)
First attempt follow up call to pt, lm on vm 

## 2019-07-29 ENCOUNTER — Encounter: Payer: Self-pay | Admitting: Gastroenterology

## 2020-02-28 ENCOUNTER — Other Ambulatory Visit: Payer: Self-pay | Admitting: Family Medicine

## 2020-02-28 DIAGNOSIS — F411 Generalized anxiety disorder: Secondary | ICD-10-CM

## 2020-03-01 ENCOUNTER — Encounter: Payer: Self-pay | Admitting: Family Medicine

## 2020-03-15 ENCOUNTER — Other Ambulatory Visit: Payer: Self-pay | Admitting: Family Medicine

## 2020-03-15 DIAGNOSIS — Z1231 Encounter for screening mammogram for malignant neoplasm of breast: Secondary | ICD-10-CM

## 2020-04-05 ENCOUNTER — Encounter: Payer: Self-pay | Admitting: Family Medicine

## 2020-04-05 DIAGNOSIS — E559 Vitamin D deficiency, unspecified: Secondary | ICD-10-CM

## 2020-04-05 DIAGNOSIS — R7301 Impaired fasting glucose: Secondary | ICD-10-CM

## 2020-04-05 DIAGNOSIS — Z1159 Encounter for screening for other viral diseases: Secondary | ICD-10-CM

## 2020-04-05 DIAGNOSIS — Z0000 Encounter for general adult medical examination without abnormal findings: Secondary | ICD-10-CM

## 2020-04-06 ENCOUNTER — Encounter: Payer: Self-pay | Admitting: Family Medicine

## 2020-04-06 ENCOUNTER — Other Ambulatory Visit: Payer: Self-pay

## 2020-04-06 ENCOUNTER — Telehealth (INDEPENDENT_AMBULATORY_CARE_PROVIDER_SITE_OTHER): Payer: Commercial Managed Care - PPO | Admitting: Family Medicine

## 2020-04-06 VITALS — BP 120/60 | Temp 98.6°F | Ht 62.75 in | Wt 152.0 lb

## 2020-04-06 DIAGNOSIS — R21 Rash and other nonspecific skin eruption: Secondary | ICD-10-CM | POA: Diagnosis not present

## 2020-04-06 MED ORDER — TRIAMCINOLONE ACETONIDE 0.1 % EX CREA: 1.0000 "application " | TOPICAL_CREAM | Freq: Two times a day (BID) | CUTANEOUS | 0 refills | Status: DC | PRN

## 2020-04-06 NOTE — Progress Notes (Signed)
Start time: 4:35 End time: 4:49  Virtual Visit via Video Note  I connected with Erin Porter on 04/06/20 by a video enabled telemedicine application and verified that I am speaking with the correct person using two identifiers.  Location: Patient:  In car (son driving) Provider: office   I discussed the limitations of evaluation and management by telemedicine and the availability of in person appointments. The patient expressed understanding and agreed to proceed.  History of Present Illness:  Chief Complaint  Patient presents with  . Rash    VIRTUAL rash on her neck that has been there over a week, but not more than two weeks.    Patient presents with an itchy rash on the left side of her neck that has been present for 1-2 weeks.  It is rough in texture, raised.  She noticed the two red areas at the same time, with some redness extending further back on the neck.  Hasn't noticed any significant change or spread in the last week.  She hasn't worn any new jewelry, purses/straps or other new contacts to the area.  Unsure if could have been a bite or other exposure.  No new products. No rash elsewhere on body. She hasn't tried any OTC medications yet  PMH, PSH, SH reviewed  Meds: lexapro 10mg  daily  No Known Allergies  ROS: no fever, chills, other rashes, URI symptoms, or other complaints. See HPI  Observations/Objective:  BP 120/60   Temp 98.6 F (37 C) (Temporal)   Ht 5' 2.75" (1.594 m)   Wt 152 lb (68.9 kg)   BMI 27.14 kg/m   Wt Readings from Last 3 Encounters:  04/06/20 152 lb (68.9 kg)  07/23/19 173 lb (78.5 kg)  07/09/19 173 lb (78.5 kg)   Well-appearing, pleasant female, in good spirits. See photos sent in MyChart message. Also able to clearly see the area of concern on video.  On the left side of her neck, anteriorly, there are two erythematous raised areas. There is a faint erythema that extends somewhat more posteriorly.  Not vesicular, no  crusting.  She is alert, oriented, cranial nerves grossly intact.  Normal mood, affect, hygiene and grooming   Assessment and Plan:   Rash - Ddx reviewed--contact derm vs insect bite most likely. TAC BID for up to 2 weeks. Recheck at upcoming visit, send updates sooner prn - Plan: triamcinolone (KENALOG) 0.1 %   [facility] [address]   Follow Up Instructions:    I discussed the assessment and treatment plan with the patient. The patient was provided an opportunity to ask questions and all were answered. The patient agreed with the plan and demonstrated an understanding of the instructions.   The patient was advised to call back or seek an in-person evaluation if the symptoms worsen or if the condition fails to improve as anticipated.  I spent 17 minutes dedicated to the care of this patient, including pre-visit review of records, face to face time, post-visit ordering of testing and documentation.    Vikki Ports, MD

## 2020-04-12 ENCOUNTER — Other Ambulatory Visit: Payer: Self-pay

## 2020-04-12 ENCOUNTER — Other Ambulatory Visit: Payer: Commercial Managed Care - PPO

## 2020-04-12 DIAGNOSIS — E559 Vitamin D deficiency, unspecified: Secondary | ICD-10-CM

## 2020-04-12 DIAGNOSIS — Z0000 Encounter for general adult medical examination without abnormal findings: Secondary | ICD-10-CM

## 2020-04-12 DIAGNOSIS — R7301 Impaired fasting glucose: Secondary | ICD-10-CM

## 2020-04-12 DIAGNOSIS — Z1159 Encounter for screening for other viral diseases: Secondary | ICD-10-CM

## 2020-04-13 LAB — LIPID PANEL
Chol/HDL Ratio: 2.6 ratio (ref 0.0–4.4)
Cholesterol, Total: 164 mg/dL (ref 100–199)
HDL: 64 mg/dL (ref >39)
LDL Chol Calc (NIH): 85 mg/dL (ref 0–99)
Triglycerides: 80 mg/dL (ref 0–149)
VLDL Cholesterol Cal: 15 mg/dL (ref 5–40)

## 2020-04-13 LAB — CBC WITH DIFFERENTIAL/PLATELET
Basophils Absolute: 0.1 10*3/uL (ref 0.0–0.2)
Basos: 2 %
EOS (ABSOLUTE): 0.1 10*3/uL (ref 0.0–0.4)
Eos: 2 %
Hematocrit: 39.1 % (ref 34.0–46.6)
Hemoglobin: 13.1 g/dL (ref 11.1–15.9)
Immature Grans (Abs): 0 10*3/uL (ref 0.0–0.1)
Immature Granulocytes: 0 %
Lymphocytes Absolute: 1.7 10*3/uL (ref 0.7–3.1)
Lymphs: 39 %
MCH: 29.7 pg (ref 26.6–33.0)
MCHC: 33.5 g/dL (ref 31.5–35.7)
MCV: 89 fL (ref 79–97)
Monocytes Absolute: 0.4 10*3/uL (ref 0.1–0.9)
Monocytes: 9 %
Neutrophils Absolute: 2.2 10*3/uL (ref 1.4–7.0)
Neutrophils: 48 %
Platelets: 265 10*3/uL (ref 150–450)
RBC: 4.41 x10E6/uL (ref 3.77–5.28)
RDW: 12.6 % (ref 11.7–15.4)
WBC: 4.5 10*3/uL (ref 3.4–10.8)

## 2020-04-13 LAB — COMPREHENSIVE METABOLIC PANEL
ALT: 25 IU/L (ref 0–32)
AST: 26 IU/L (ref 0–40)
Albumin/Globulin Ratio: 2 (ref 1.2–2.2)
Albumin: 4.5 g/dL (ref 3.8–4.9)
Alkaline Phosphatase: 61 IU/L (ref 44–121)
BUN/Creatinine Ratio: 14 (ref 9–23)
BUN: 14 mg/dL (ref 6–24)
Bilirubin Total: 0.4 mg/dL (ref 0.0–1.2)
CO2: 27 mmol/L (ref 20–29)
Calcium: 9.5 mg/dL (ref 8.7–10.2)
Chloride: 101 mmol/L (ref 96–106)
Creatinine, Ser: 1.03 mg/dL — ABNORMAL HIGH (ref 0.57–1.00)
GFR calc Af Amer: 72 mL/min/{1.73_m2} (ref >59)
GFR calc non Af Amer: 63 mL/min/{1.73_m2} (ref >59)
Globulin, Total: 2.2 g/dL (ref 1.5–4.5)
Glucose: 95 mg/dL (ref 65–99)
Potassium: 4.7 mmol/L (ref 3.5–5.2)
Sodium: 141 mmol/L (ref 134–144)
Total Protein: 6.7 g/dL (ref 6.0–8.5)

## 2020-04-13 LAB — HEPATITIS C ANTIBODY: Hep C Virus Ab: <0.1 s/co ratio (ref 0.0–0.9)

## 2020-04-13 LAB — HEMOGLOBIN A1C
Est. average glucose Bld gHb Est-mCnc: 103 mg/dL
Hgb A1c MFr Bld: 5.2 % (ref 4.8–5.6)

## 2020-04-13 LAB — VITAMIN D 25 HYDROXY (VIT D DEFICIENCY, FRACTURES): Vit D, 25-Hydroxy: 45.6 ng/mL (ref 30.0–100.0)

## 2020-04-18 NOTE — Progress Notes (Signed)
Chief Complaint  Patient presents with  . Annual Exam    Nonfasting annual exam with pap.     Erin Porter is a 55 y.o. female who presents for a complete physical. She had labs done prior to today's visit, see below.  She was recently seen for virtual visit for rash on her neck.  She reports that it resolved quickly with use of TAC, though it did recur.  Recurred with a certain shirt, rubbed by seatbelt in car, and resolved again with use of TAC.   Anxiety: Taking lexaprowithout side effects. She had stopped it for a few months in the past, restarted it in 07/2016 when she had recurrent symptoms ("edgy"/irritable, low energy, decreased motivation). She continues to do well, happily re-married, working well with her ex-husband. Prefers to stay on the medication until she gets on a regular exercise routine, then will try cutting back.  No longer having any sleep issues.   Irregular menses, last was in 11/2019, and prior to that was 02/2019. She had some hot flashes, which stopped since she started [place] in December.  H/oVitamin D deficiency: Level was 28.6 last year when taking MVI daily.  She was advised to take an extra 1000 IU of D3 daily through the end of March, and take it October through March every year (when getting less from the sun), and to continue your daily multivitamin. She took extra 1000 IU of Vitamin D until she started [place] in early December. Also stopped MVI daily (as instructed per Opt[employer] program).  She has lost weight on Optavia, started in early December Goal is 135#, to then switch plans so she can exercise more.  Immunization History  Administered Date(s) Administered  . Influenza Split 11/03/2008, 12/05/2010, 11/15/2011  . Influenza,inj,Quad PF,6+ Mos 11/17/2012, 03/01/2016, 12/20/2016  . Influenza-Unspecified 12/27/2017, 01/01/2019, 03/11/2020  . Moderna Sars-Covid-2 Vaccination 05/14/2019, 06/11/2019, 03/21/2020  . Tdap 11/03/2008, 03/25/2018    Last Pap smear: 02/2018--normal, no high risk HPV (prior pap was 02/2017--normal, but +HR HPV noted) Last mammogram: 04/2019, has appt scheduled for 04/2020 Last colonoscopy: 06/2019, sessile serrated polyp, advised 3 year f/u recommended (Dr. Silverio Decamp) Last DEXA: never Dentist: twice yearly Ophtho: yearly Sees dermatologist yearly, went recently Exercise:  Walking dogs a mile/day (20 minutes) No weight-bearing exercise.   PMH, PSH, SH and FH reviewed and updated  Outpatient Encounter Medications as of 04/19/2020  Medication Sig Note  . escitalopram (LEXAPRO) 10 MG tablet TAKE 1 TABLET BY MOUTH DAILY   . ALPRAZolam (XANAX) 0.5 MG tablet Take 0.5-1 tablets (0.25-0.5 mg total) by mouth 3 (three) times daily as needed for anxiety. (Patient not taking: No sig reported) 03/25/2018: Uses prn, about 2x/month (if she wakes up in the middle of the night).  . fluorouracil (EFUDEX) 5 % cream Apply 1 application topically 2 (two) times daily. Reported on 05/10/2015 (Patient not taking: No sig reported)   . triamcinolone (KENALOG) 0.1 % Apply 1 application topically 2 (two) times daily as needed (rash, itching). Apply sparingly to affected area of skin twice daily for up to 2 weeks (Patient not taking: Reported on 04/19/2020)    No facility-administered encounter medications on file as of 04/19/2020.   No Known Allergies  ROS: The patient denies anorexia, fever, headaches, vision changes, decreased hearing, ear pain, sore throat, breast concerns, chest pain, palpitations, dizziness, syncope, dyspnea on exertion, cough, swelling, nausea, vomiting, diarrhea, constipation, abdominal pain, melena, hematochezia, indigestion/heartburn, hematuria, dysuria, vaginal discharge, odor or itch, genital lesions, joint pains, numbness,  tingling, weakness, tremor, rashes, depression, abnormal bleeding/bruising, or enlarged lymph nodes. Moods are good, anxiety is well controlled. Some irregular menses (just 2 in the last  year).  No hot flashes or night sweats. Had some, but resolved since on Optavia. Recent rash on neck resolved with topical steroids, per HPI. Sees dermatologist regularly, denies skin concerns.   PHYSICAL EXAM:  BP 110/70   Pulse 68   Ht 5' 2.5" (1.588 m)   Wt 150 lb 6.4 oz (68.2 kg)   LMP 12/21/2019 (Exact Date)   BMI 27.07 kg/m   Wt Readings from Last 3 Encounters:  04/19/20 150 lb 6.4 oz (68.2 kg)  04/06/20 152 lb (68.9 kg)  07/23/19 173 lb (78.5 kg)   General Appearance:  Alert, cooperative, no distress, appears stated age  Head:  Normocephalic, without obvious abnormality, atraumatic  Eyes:  PERRL, conjunctiva/corneas clear, EOM's intact, fundi benign  Ears:  Normal TM's and external ear canals  Nose: Not examined, wearing mask due to COVID-19 pandemic  Throat:   Not examined, wearing mask due to COVID-19 pandemic  Neck: Supple, no lymphadenopathy; thyroid: noenlargement/tenderness/ nodules; no carotid bruit or JVD  Back:  Spine nontender, no curvature, ROM normal, no CVAtenderness  Lungs:  Clear to auscultation bilaterally without wheezes, rales orronchi; respirations unlabored  Chest Wall:  No tenderness or deformity  Heart:  Regular rate and rhythm, S1 and S2 normal, no murmur, rub or gallop  Breast Exam:  No tenderness, masses, or nipple discharge or inversion. No axillary lymphadenopathy  Abdomen:  Soft, non-tender, nondistended, normoactive bowel sounds, no masses, no hepatosplenomegaly  Genitalia:  Normal external genitalia without lesions. BUS and vagina normal; no cervical motion tenderness, no cervical lesions. There is some thick, white vaginal discharge. No odor. Uterus and adnexa not enlarged, nontender, no masses. Pap performed  Rectal:  Normal tone, no masses or tenderness; guaiac negative stool  Extremities: No clubbing, cyanosis or edema  Pulses: 2+ and symmetric all extremities  Skin: Skin color, texture, turgor  normal, no lesions.  There is a blistered area (hemorrhagic) on the dorsum of the R ring finger, radial aspect, an area that was treated 2 days ago with cryotherapy by dermatologist.  Lymph nodes: Cervical, supraclavicular, and axillary nodes normal  Neurologic: Normal strength, sensation and gait; reflexes 2+ and symmetric throughout  Psych: Normal mood, affect, hygiene and grooming.     Chemistry      Component Value Date/Time   NA 141 04/12/2020 0820   K 4.7 04/12/2020 0820   CL 101 04/12/2020 0820   CO2 27 04/12/2020 0820   BUN 14 04/12/2020 0820   CREATININE 1.03 (H) 04/12/2020 0820   CREATININE 0.97 10/11/2016 1108      Component Value Date/Time   CALCIUM 9.5 04/12/2020 0820   ALKPHOS 61 04/12/2020 0820   AST 26 04/12/2020 0820   ALT 25 04/12/2020 0820   BILITOT 0.4 04/12/2020 0820     Fasting glucose 95  Lab Results  Component Value Date   HGBA1C 5.2 04/12/2020   Vitamin D-OH 45.6  Lab Results  Component Value Date   WBC 4.5 04/12/2020   HGB 13.1 04/12/2020   HCT 39.1 04/12/2020   MCV 89 04/12/2020   PLT 265 04/12/2020   Lab Results  Component Value Date   CHOL 164 04/12/2020   HDL 64 04/12/2020   LDLCALC 85 04/12/2020   TRIG 80 04/12/2020   CHOLHDL 2.6 04/12/2020   Negative Hep C Ab   ASSESSMENT/PLAN:  Annual  physical exam - Normal exam; possible yeast infection--to use OTC Monistat if develops sx. Congratulated on wt loss. Cont healthy diet, exercise, wt loss - Plan: POCT Urinalysis DIP ([facility] Device), Cytology - PAP([facility])  Vitamin D deficiency - resolved.  Getting adequate amounts from [place] program  Generalized anxiety disorder - well controlled; discussed how to try coming off in future (not to rush--stay at 1/2 tablet for 1-2 mos)   Discussed monthly self breast exams and yearly mammograms; at least 30 minutes of aerobic activity at least 5 days/week, weight-bearing exercise at least 2x/week;  proper sunscreen use reviewed; healthy diet, including goals of calcium and vitamin D intake and alcohol recommendations (less than or equal to 1 drink/day) reviewed; regular seatbelt use; changing batteries in smoke detectors, carbon monoxide detectors. Immunization recommendations discussed--continue yearly flu shots. Shingrix recommended,  risks/side effects reviewed, to check insurance and schedule [place] when convenient. Colonoscopy due again 06/2022.   F/u 1 year, sooner prn

## 2020-04-18 NOTE — Patient Instructions (Incomplete)
  HEALTH MAINTENANCE RECOMMENDATIONS:  It is recommended that you get at least 30 minutes of aerobic exercise at least 5 days/week (for weight loss, you may need as much as 60-90 minutes). This can be any activity that gets your heart rate up. This can be divided in 10-15 minute intervals if needed, but try and build up your endurance at least once a week.  Weight bearing exercise is also recommended twice weekly.  Eat a healthy diet with lots of vegetables, fruits and fiber.  "Colorful" foods have a lot of vitamins (ie green vegetables, tomatoes, red peppers, etc).  Limit sweet tea, regular sodas and alcoholic beverages, all of which has a lot of calories and sugar.  Up to 1 alcoholic drink daily may be beneficial for women (unless trying to lose weight, watch sugars).  Drink a lot of water.  Calcium recommendations are 1200-1500 mg daily (1500 mg for postmenopausal women or women without ovaries), and vitamin D 1000 IU daily.  This should be obtained from diet and/or supplements (vitamins), and calcium should not be taken all at once, but in divided doses.  Monthly self breast exams and yearly mammograms for women over the age of 77 is recommended.  Sunscreen of at least SPF 30 should be used on all sun-exposed parts of the skin when outside between the hours of 10 am and 4 pm (not just when at beach or pool, but even with exercise, golf, tennis, and yard work!)  Use a sunscreen that says "broad spectrum" so it covers both UVA and UVB rays, and make sure to reapply every 1-2 hours.  Remember to change the batteries in your smoke detectors when changing your clock times in the spring and fall. Carbon monoxide detectors are recommended for your home.  Use your seat belt every time you are in a car, and please drive safely and not be distracted with cell phones and texting while driving.  I recommend getting the new shingles vaccine (Shingrix). Since you have commercial insurance, check with your  insurance to verify what your out of pocket cost may be (usually covered as preventative, but better to verify to avoid any surprises, as this vaccine is expensive), and then schedule a nurse visit at our office when convenient (based on the possible side effects as discussed).   This is a series of 2 injections, spaced 2 months apart.  It doesn't have to be exactly 2 months apart (but can't be under 2 months), if that isn't feasible for your schedule, but try and get them close to 2 months (and definitely within 6 months of each other, or else the efficacy of the vaccine drops off).

## 2020-04-19 ENCOUNTER — Ambulatory Visit (INDEPENDENT_AMBULATORY_CARE_PROVIDER_SITE_OTHER): Payer: Commercial Managed Care - PPO | Admitting: Family Medicine

## 2020-04-19 ENCOUNTER — Encounter: Payer: Self-pay | Admitting: Family Medicine

## 2020-04-19 ENCOUNTER — Other Ambulatory Visit (HOSPITAL_COMMUNITY)
Admission: RE | Admit: 2020-04-19 | Discharge: 2020-04-19 | Disposition: A | Discharge status: Home / Self Care | Payer: Commercial Managed Care - PPO | Source: Ambulatory Visit | Attending: Family Medicine | Admitting: Family Medicine

## 2020-04-19 ENCOUNTER — Other Ambulatory Visit: Payer: Self-pay

## 2020-04-19 VITALS — BP 110/70 | HR 68 | Ht 62.5 in | Wt 150.4 lb

## 2020-04-19 DIAGNOSIS — F411 Generalized anxiety disorder: Secondary | ICD-10-CM

## 2020-04-19 DIAGNOSIS — Z0000 Encounter for general adult medical examination without abnormal findings: Secondary | ICD-10-CM | POA: Diagnosis present

## 2020-04-19 DIAGNOSIS — E559 Vitamin D deficiency, unspecified: Secondary | ICD-10-CM

## 2020-04-19 LAB — POCT URINALYSIS DIP (PROADVANTAGE DEVICE)
Bilirubin, UA: NEGATIVE
Blood, UA: NEGATIVE
Glucose, UA: NEGATIVE mg/dL
Ketones, POC UA: NEGATIVE mg/dL
Leukocytes, UA: NEGATIVE
Nitrite, UA: NEGATIVE
Protein Ur, POC: NEGATIVE mg/dL
Specific Gravity, Urine: 1.005
Urobilinogen, Ur: NEGATIVE
pH, UA: 7 (ref 5.0–8.0)

## 2020-04-21 ENCOUNTER — Encounter: Payer: Self-pay | Admitting: Family Medicine

## 2020-04-21 LAB — CYTOLOGY - PAP
Comment: NEGATIVE
Diagnosis: NEGATIVE
High risk HPV: NEGATIVE

## 2020-05-17 ENCOUNTER — Ambulatory Visit: Payer: BC Managed Care – PPO

## 2020-06-09 ENCOUNTER — Encounter: Payer: Self-pay | Admitting: Family Medicine

## 2020-06-09 DIAGNOSIS — F411 Generalized anxiety disorder: Secondary | ICD-10-CM

## 2020-06-09 MED ORDER — ESCITALOPRAM OXALATE 10 MG PO TABS: ORAL_TABLET | ORAL | 3 refills | Status: DC

## 2020-07-07 ENCOUNTER — Ambulatory Visit
Admission: RE | Admit: 2020-07-07 | Discharge: 2020-07-07 | Disposition: A | Discharge status: Home / Self Care | Payer: Commercial Managed Care - PPO | Source: Ambulatory Visit | Attending: Family Medicine | Admitting: Family Medicine

## 2020-07-07 ENCOUNTER — Other Ambulatory Visit: Payer: Self-pay

## 2020-07-07 DIAGNOSIS — Z1231 Encounter for screening mammogram for malignant neoplasm of breast: Secondary | ICD-10-CM

## 2020-08-09 ENCOUNTER — Encounter: Payer: Self-pay | Admitting: Family Medicine

## 2020-08-09 MED ORDER — ALPRAZOLAM 0.5 MG PO TABS: 0.2500 mg | ORAL_TABLET | Freq: Three times a day (TID) | ORAL | 0 refills | Status: DC | PRN

## 2020-09-20 ENCOUNTER — Telehealth: Payer: Self-pay | Admitting: Family Medicine

## 2020-09-20 NOTE — Telephone Encounter (Signed)
Unable to reach pt again but I left her a VM to call the office so I can verify insurance

## 2020-09-20 NOTE — Telephone Encounter (Signed)
I don't recall speaking with Erin Porter about this (she is a current patient). If patient has Medicare, we aren't taking new medicare. If not on medicare, I will take her

## 2020-09-20 NOTE — Telephone Encounter (Signed)
Sent Meg personal message--not taking new patients at this time (whole office), and no new Medicare when schedules improve.

## 2020-09-20 NOTE — Telephone Encounter (Signed)
Pts mother Sheral Flow called and said you agreed to take her on as a new pt? Just wanted to confirm before scheduling. Pts number is [phone]

## 2021-04-25 ENCOUNTER — Encounter: Payer: Commercial Managed Care - PPO | Admitting: Family Medicine

## 2021-05-07 ENCOUNTER — Encounter: Payer: Self-pay | Admitting: Family Medicine

## 2021-05-08 NOTE — Patient Instructions (Addendum)
?  HEALTH MAINTENANCE RECOMMENDATIONS: ? ?It is recommended that you get at least 30 minutes of aerobic exercise at least 5 days/week (for weight loss, you may need as much as 60-90 minutes). This can be any activity that gets your heart rate up. This can be divided in 10-15 minute intervals if needed, but try and build up your endurance at least once a week.  Weight bearing exercise is also recommended twice weekly. ? ?Eat a healthy diet with lots of vegetables, fruits and fiber.  "Colorful" foods have a lot of vitamins (ie green vegetables, tomatoes, red peppers, etc).  Limit sweet tea, regular sodas and alcoholic beverages, all of which has a lot of calories and sugar.  Up to 1 alcoholic drink daily may be beneficial for women (unless trying to lose weight, watch sugars).  Drink a lot of water. ? ?Calcium recommendations are 1200-1500 mg daily (1500 mg for postmenopausal women or women without ovaries), and vitamin D 1000 IU daily.  This should be obtained from diet and/or supplements (vitamins), and calcium should not be taken all at once, but in divided doses. ? ?Monthly self breast exams and yearly mammograms for women over the age of 91 is recommended. ? ?Sunscreen of at least SPF 30 should be used on all sun-exposed parts of the skin when outside between the hours of 10 am and 4 pm (not just when at beach or pool, but even with exercise, golf, tennis, and yard work!)  Use a sunscreen that says "broad spectrum" so it covers both UVA and UVB rays, and make sure to reapply every 1-2 hours. ? ?Remember to change the batteries in your smoke detectors when changing your clock times in the spring and fall. Carbon monoxide detectors are recommended for your home. ? ?Use your seat belt every time you are in a car, and please drive safely and not be distracted with cell phones and texting while driving. ? ? ?I recommend getting the new shingles vaccine (Shingrix). You may want to check with your insurance to verify  what your out of pocket cost may be (usually covered as preventative, but better to verify to avoid any surprises, as this vaccine is expensive), and then schedule a nurse visit at our office when convenient (based on the possible side effects as discussed).   ?This is a series of 2 injections, spaced 2 months apart.  It doesn't have to be exactly 2 months apart (but can't be sooner), if that isn't feasible for your schedule, but try and get them close to 2 months (and definitely within 6 months of each other, or else the efficacy of the vaccine drops off). ?This should be separated from other vaccines by at least 2 weeks. ? ?I encourage you to continue vitamin D3 1000 IU daily (since you are no longer on Optavia, and use sunscreen very regularly). ? ?

## 2021-05-08 NOTE — Progress Notes (Signed)
?Chief Complaint  ?Patient presents with  ? Annual Exam  ?  Nonfasting annual exam with pelvic. No concerns. States she takes vit D up until Daylight Savings-stopped last week.   ? ? ?Erin Porter is a 56 y.o. female who presents for a complete physical.  ? ?Anxiety: Taking lexapro without side effects. She had stopped it for a few months in the past, restarted it in 07/2016 when she had recurrent symptoms ("edgy"/irritable, low energy, decreased motivation). She continues to do well, happily re-married, working well with her ex-husband.  Mother is temporarily living with her (along with her 4 dogs, in addition to pt's 2); planning to build a cottage on their property for her. Once a week she feels a little "edginess" related to having her mom always be home. She would like to continue on the lexapro. ? ?Irregular menses. She had some hot flashes, which stopped when she started Optavia in December 2021.  She has only had 1 period since her last visit here, in 05/2020.  Still no hot flashes or night sweats, and is no longer on Optavia. ? ?H/o Vitamin D deficiency:  Level was normal at 45.6 in 03/2020, when doing [place] program (and not taking other vitamins).  She takes D3 only over the winter, stopped last week. Previously had been 28.6 when taking MVI daily, without separate D3.  She is not currently taking MVI or D. ? ?She had started Optavia in early 01/2020. Had set goal to 135#, then planned to switch plans so she could exercise more. She stopped the [place] when her mom moved to [place].  She had gotten down to 140#.  Exercise changed and eating changed when mom moved in; she regained 10#, but has maintained her weight since then (lost 25# total). She is having 5 glasses of wine/week (1 bottle); last year she was just having 1-2 drinks/week.  She hasn't been able to exercise due to time constraints, weather/darkness, and mom living with her. ? ?Wt Readings from Last 3 Encounters:  ?05/09/21 150 lb 9.6 oz  (68.3 kg)  ?04/19/20 150 lb 6.4 oz (68.2 kg)  ?04/06/20 152 lb (68.9 kg)  ? ? ? ?Immunization History  ?Administered Date(s) Administered  ? Influenza Split 11/03/2008, 12/05/2010, 11/15/2011  ? Influenza,inj,Quad PF,6+ Mos 11/17/2012, 03/01/2016, 12/20/2016  ? Influenza-Unspecified 12/27/2017, 01/01/2019, 03/11/2020, 01/24/2021  ? Moderna Covid-19 Vaccine Bivalent Booster 64yr & up 01/24/2021  ? Moderna Sars-Covid-2 Vaccination 05/14/2019, 06/11/2019, 03/21/2020  ? Tdap 11/03/2008, 03/25/2018  ? ?Last Pap smear: 03/2020 normal, no high risk HPV (also normal 02/2018; pap 02/2017--normal, but +HR HPV noted) ?Last mammogram: 06/2020 ?Last colonoscopy: 06/2019, sessile serrated polyp, advised 3 year f/u recommended (Dr. NSilverio Decamp ?Last DEXA: never ?Dentist: twice yearly ?Ophtho: yearly ?Sees dermatologist yearly ?Exercise:  Walking dogs a mile/day (20 minutes), but not very aerobic. ?No weight-bearing exercise.  Plans to resume walking with her neighbor when it is light in the mornings. ? ?Lipids: ?Lab Results  ?Component Value Date  ? CHOL 164 04/12/2020  ? HDL 64 04/12/2020  ? L[place]85 04/12/2020  ? TRIG 80 04/12/2020  ? CHOLHDL 2.6 04/12/2020  ? ? ? ?PMH, PSH, SH and FH reviewed and updated ? ?Outpatient Encounter Medications as of 05/09/2021  ?Medication Sig Note  ? levocetirizine (XYZAL) 5 MG tablet Take 5 mg by mouth every evening.   ? [DISCONTINUED] escitalopram (LEXAPRO) 10 MG tablet TAKE 1 TABLET BY MOUTH DAILY   ? ALPRAZolam (XANAX) 0.5 MG tablet Take 0.5-1 tablets (0.25-0.5  mg total) by mouth 3 (three) times daily as needed for anxiety. (Patient not taking: Reported on 05/09/2021) 05/09/2021: As needed  ? escitalopram (LEXAPRO) 10 MG tablet TAKE 1 TABLET BY MOUTH DAILY   ? fluorouracil (EFUDEX) 5 % cream Apply 1 application topically 2 (two) times daily. Reported on 05/10/2015 (Patient not taking: Reported on 04/06/2020) 05/09/2021: As needed  ? triamcinolone (KENALOG) 0.1 % Apply 1 application topically 2 (two)  times daily as needed (rash, itching). Apply sparingly to affected area of skin twice daily for up to 2 weeks (Patient not taking: Reported on 04/19/2020) 05/09/2021: As needed  ? ?No facility-administered encounter medications on file as of 05/09/2021.  ? ?No Known Allergies ? ?ROS: The patient denies anorexia, fever, headaches,  vision changes, decreased hearing, ear pain, sore throat, breast concerns, chest pain, palpitations, dizziness, syncope, dyspnea on exertion, cough, swelling, nausea, vomiting, diarrhea, constipation, abdominal pain, melena, hematochezia, indigestion/heartburn, hematuria, dysuria, vaginal discharge, odor or itch, genital lesions, joint pains, numbness, tingling, weakness, tremor, rashes, depression, abnormal bleeding/bruising, or enlarged lymph nodes. ?Moods are good, anxiety is well controlled. ?No menstrual cycles since 05/2020. No significant hot flashes, night sweats, insomnia, vaginal dryness. ?Sees dermatologist regularly, denies skin concerns. ?Some recent allergies, r/b xyzal prn. ? ? ?PHYSICAL EXAM: ? ?BP 120/70   Pulse 60   Ht 5' 2.5" (1.588 m)   Wt 150 lb 9.6 oz (68.3 kg)   LMP 06/18/2020 (Exact Date)   BMI 27.11 kg/m?  ? ?Wt Readings from Last 3 Encounters:  ?05/09/21 150 lb 9.6 oz (68.3 kg)  ?04/19/20 150 lb 6.4 oz (68.2 kg)  ?04/06/20 152 lb (68.9 kg)  ?04/19/19 weight 175# 9.6 oz ? ?General Appearance:    Alert, cooperative, no distress, appears stated age  ?Head:    Normocephalic, without obvious abnormality, atraumatic  ?Eyes:    PERRL, conjunctiva/corneas clear, EOM's intact, fundi benign  ?Ears:    Normal TM's and external ear canals  ?Nose:   Not examined, wearing mask due to COVID-19 pandemic  ?Throat:   Not examined, wearing mask due to COVID-19 pandemic  ?Neck:   Supple, no lymphadenopathy;  thyroid:  no enlargement/tenderness/ nodules; no carotid bruit or JVD  ?Back:    Spine nontender, no curvature, ROM normal, no CVA tenderness  ?Lungs:     Clear to auscultation  bilaterally without wheezes, rales or ronchi; respirations unlabored  ?Chest Wall:    No tenderness or deformity  ? Heart:    Regular rate and rhythm, S1 and S2 normal, no murmur, rub or gallop  ?Breast Exam:    No tenderness, masses, or nipple discharge or inversion.  No axillary lymphadenopathy  ?Abdomen:     Soft, non-tender, nondistended, normoactive bowel sounds,  no masses, no hepatosplenomegaly  ?Genitalia:    Normal external genitalia without lesions.  BUS and vagina normal; no cervical motion tenderness, no cervical lesions. Uterus and adnexa not enlarged, nontender, no masses.  Pap not performed  ?Rectal:    Normal tone, no masses or tenderness; guaiac negative stool  ?Extremities:   No clubbing, cyanosis or edema  ?Pulses:   2+ and symmetric all extremities  ?Skin:   Skin color, texture, turgor normal, no lesions.    ?Lymph nodes:   Cervical, supraclavicular, inguinal and axillary nodes normal  ?Neurologic:   Normal strength, sensation and gait; reflexes 2+ and symmetric throughout  ?  Psych:   Normal mood, affect, hygiene and grooming. ? ?Depression screen [facility] 2/9 05/09/2021 04/19/2020 04/06/2020 04/19/2019 03/25/2018  ?Decreased Interest 0 0 0 0 0  ?Down, Depressed, Hopeless 0 0 0 0 -  ?PHQ - 2 Score 0 0 0 0 0  ?Altered sleeping 0 - - - -  ?Tired, decreased energy 0 - - - -  ?Change in appetite 0 - - - -  ?Feeling bad or failure about yourself  0 - - - -  ?Trouble concentrating 0 - - - -  ?Moving slowly or fidgety/restless 0 - - - -  ?Suicidal thoughts 0 - - - -  ?PHQ-9 Score 0 - - - -  ?Difficult doing work/chores Not difficult at all - - - -  ? ?GAD 7 : Generalized Anxiety Score 05/09/2021  ?Nervous, Anxious, on Edge 1  ?Control/stop worrying 0  ?Worry too much - different things 0  ?Trouble relaxing 0  ?Restless 0  ?Easily annoyed or irritable 0  ?Afraid - awful might happen 0  ?Total GAD 7 Score 1  ?Anxiety Difficulty Not difficult at all  ? ? ? ?ASSESSMENT/PLAN: ? ?Annual  physical exam - Plan: POCT Urinalysis DIP (Proadvantage Device) ? ?Vitamin D deficiency - encouraged daily supplement of 1000 IU (either from MVI or D3) ? ?Generalized anxiety disorder - doing well on lexapro, continue

## 2021-05-09 ENCOUNTER — Encounter: Payer: Self-pay | Admitting: Family Medicine

## 2021-05-09 ENCOUNTER — Ambulatory Visit (INDEPENDENT_AMBULATORY_CARE_PROVIDER_SITE_OTHER): Payer: Commercial Managed Care - PPO | Admitting: Family Medicine

## 2021-05-09 ENCOUNTER — Other Ambulatory Visit: Payer: Self-pay

## 2021-05-09 VITALS — BP 120/70 | HR 60 | Ht 62.5 in | Wt 150.6 lb

## 2021-05-09 DIAGNOSIS — E559 Vitamin D deficiency, unspecified: Secondary | ICD-10-CM

## 2021-05-09 DIAGNOSIS — Z0000 Encounter for general adult medical examination without abnormal findings: Secondary | ICD-10-CM

## 2021-05-09 DIAGNOSIS — F411 Generalized anxiety disorder: Secondary | ICD-10-CM | POA: Diagnosis not present

## 2021-05-09 LAB — POCT URINALYSIS DIP (PROADVANTAGE DEVICE)
Bilirubin, UA: NEGATIVE
Blood, UA: NEGATIVE
Glucose, UA: NEGATIVE mg/dL
Ketones, POC UA: NEGATIVE mg/dL
Leukocytes, UA: NEGATIVE
Nitrite, UA: NEGATIVE
Protein Ur, POC: NEGATIVE mg/dL
Specific Gravity, Urine: 1.01
Urobilinogen, Ur: NEGATIVE
pH, UA: 7 (ref 5.0–8.0)

## 2021-05-09 MED ORDER — ESCITALOPRAM OXALATE 10 MG PO TABS: ORAL_TABLET | ORAL | 3 refills | Status: DC

## 2021-05-28 ENCOUNTER — Other Ambulatory Visit: Payer: Self-pay | Admitting: Family Medicine

## 2021-05-28 DIAGNOSIS — Z1231 Encounter for screening mammogram for malignant neoplasm of breast: Secondary | ICD-10-CM

## 2021-07-11 ENCOUNTER — Ambulatory Visit
Admission: RE | Admit: 2021-07-11 | Discharge: 2021-07-11 | Disposition: A | Discharge status: Home / Self Care | Payer: Commercial Managed Care - PPO | Source: Ambulatory Visit | Attending: Family Medicine | Admitting: Family Medicine

## 2021-07-11 DIAGNOSIS — Z1231 Encounter for screening mammogram for malignant neoplasm of breast: Secondary | ICD-10-CM

## 2021-07-19 ENCOUNTER — Telehealth (INDEPENDENT_AMBULATORY_CARE_PROVIDER_SITE_OTHER): Payer: Commercial Managed Care - PPO | Admitting: Family Medicine

## 2021-07-19 ENCOUNTER — Encounter: Payer: Self-pay | Admitting: Family Medicine

## 2021-07-19 VITALS — Ht 62.5 in | Wt 141.0 lb

## 2021-07-19 DIAGNOSIS — N959 Unspecified menopausal and perimenopausal disorder: Secondary | ICD-10-CM | POA: Diagnosis not present

## 2021-07-19 NOTE — Progress Notes (Signed)
Start time: 11:58 End time: 12:29  Virtual Visit via Video Note  I connected with Erin Porter on 07/19/21 by a video enabled telemedicine application and verified that I am speaking with the correct person using two identifiers.  Location: Patient: work.   Had to use audio from phone (which disabled her camera, but she could see me via video; I was able to see her at onset of visit, but not audio at that time).  Provider: office   I discussed the limitations of evaluation and management by telemedicine and the availability of in person appointments. The patient expressed understanding and agreed to proceed.  History of Present Illness:  Chief Complaint  Patient presents with   Menopause    VIRTUAL discuss menopause symptoms. Hot flashes, wakes up in the middle of the night drenched in sweat and cannot go back to sleep. Feels more on edge. Forgetfullness-people's names, etc. Vaginal dryness.    Patient presents to discuss menopausal symptoms, with worsening sleep related to night sweats.  She started with vaginal dryness in the last 2 months. Lubricants help a little. She hasn't slept well over the last month, waking up between 2-4am with night sweats.  Hot flashes during the day are mild/tolerable. More difficulty remembering names. LMP over a year ago, 05/2020  Hasn't tried any OTC measures yet. Previously took OCP's without issues. No heart disease, blood clots, or other contraindications. Mammo UTD (recent, normal).  She is back on Optavia, so not taking any additional vitamins/supplements currently. She remains on lexapro.  PMH, PSH, SH reviewed  Outpatient Encounter Medications as of 07/19/2021  Medication Sig Note   escitalopram (LEXAPRO) 10 MG tablet TAKE 1 TABLET BY MOUTH DAILY    ALPRAZolam (XANAX) 0.5 MG tablet Take 0.5-1 tablets (0.25-0.5 mg total) by mouth 3 (three) times daily as needed for anxiety. (Patient not taking: Reported on 05/09/2021) 05/09/2021: As  needed   fluorouracil (EFUDEX) 5 % cream Apply 1 application topically 2 (two) times daily. Reported on 05/10/2015 (Patient not taking: Reported on 04/06/2020) 05/09/2021: As needed   levocetirizine (XYZAL) 5 MG tablet Take 5 mg by mouth every evening. (Patient not taking: Reported on 07/19/2021)    triamcinolone (KENALOG) 0.1 % Apply 1 application topically 2 (two) times daily as needed (rash, itching). Apply sparingly to affected area of skin twice daily for up to 2 weeks (Patient not taking: Reported on 04/19/2020) 05/09/2021: As needed   No facility-administered encounter medications on file as of 07/19/2021.   No Known Allergies  ROS: no f/c, chest pain, shortness of breath, h/o blood clots.  +hot flashes, night sweats, vaginal dryness and interrupted/poor sleep as per HPI. +intentional weight loss (back on Optavia). Moods are good.  Observations/Objective:  Ht 5' 2.5" (1.588 m)   Wt 141 lb (64 kg)   BMI 25.38 kg/m   Wt Readings from Last 3 Encounters:  07/19/21 141 lb (64 kg)  05/09/21 150 lb 9.6 oz (68.3 kg)  04/19/20 150 lb 6.4 oz (68.2 kg)   Pleasant, well-appearing female in no distress She is alert, oriented, grossly normal cranial nerves. Normal speech, grooming, mood and affect. Exam is limited due to virtual nature of the visit.  Assessment and Plan:  Postmenopausal symptoms - Discussed risks/benefits of HRT, and OTC supplements. Prefers to try OTC, will contact us for Rx if ineffective. Declines any sleep meds currently  We discussed OTC soy, black cohosh (found in supplements such as Estroven). She declined ambien for sleep, preferring to try  melatonin or other OTC. Discussed safest regimen, bio-identical being estradiol patch and prometrium. She was asking about combo pills, similar to OCP's she has taken in the past. Info provided in AVS--potentially less thromboembolic risk with patch vs oral. Consider Jinteli/Fyavolv or Activella if prefers oral.   Melatonin,  ambien discussed/declined Discussed HRT in detail--will try OTC first. Contact us if sx not improving or worsening. Can also consider trial of increasing SSRI dose.    Follow Up Instructions:    I discussed the assessment and treatment plan with the patient. The patient was provided an opportunity to ask questions and all were answered. The patient agreed with the plan and demonstrated an understanding of the instructions.   The patient was advised to call back or seek an in-person evaluation if the symptoms worsen or if the condition fails to improve as anticipated.  I spent 34 minutes dedicated to the care of this patient, including pre-visit review of records, face to face time, post-visit ordering of testing and documentation.    Vikki Ports, MD

## 2021-07-20 NOTE — Patient Instructions (Addendum)
We discussed trial of OTC agents to help with symptoms (ie Estroven), and if that isn't effective, next step is prescription hormone replacement.  There aren't great studies about these being effective, but it may be worth a try before trying hormonal therapy.  You don't have contraindications to therapy (normal recent mammogram, no issues with birth control pills in the past, no known heart disease, stroke, blood clot, etc--I don't think your mom's PE related to COVID is a contraindication for you, if you have tolerated birth control pills in the past).  The reason I mentioned the estrogen patches is that this route theoretically has a lower risk of blood clots compared to the oral route. The patch would be an estradiol patch (ie Vivelle dot), and the oral progesterone would be prometrium.  Combination pills (more similar to the birth control pills, taking 1 pill every day) include Jinteli or Fyavolv (norethindrone acetate/ethinyl estradiol), Activella.  Feel free to research these and let me know which you prefer, if any of the herbal/OTC supplements don't do the trick for you.  Another option that we didn't really discuss would be to try increasing the lexapro dose (since SSRI's can help, and you're already on one).

## 2021-07-25 ENCOUNTER — Encounter: Payer: Self-pay | Admitting: Family Medicine

## 2021-07-25 DIAGNOSIS — N959 Unspecified menopausal and perimenopausal disorder: Secondary | ICD-10-CM

## 2021-07-26 MED ORDER — NORETHINDRONE-ETH ESTRADIOL 1-5 MG-MCG PO TABS: 1.0000 | ORAL_TABLET | Freq: Every day | ORAL | 5 refills | Status: DC

## 2021-07-26 MED ORDER — GABAPENTIN 300 MG PO CAPS: 300.0000 mg | ORAL_CAPSULE | Freq: Every day | ORAL | 0 refills | Status: DC

## 2021-09-20 ENCOUNTER — Encounter: Payer: Self-pay | Admitting: Family Medicine

## 2021-09-24 ENCOUNTER — Encounter: Payer: Self-pay | Admitting: Family Medicine

## 2021-09-24 ENCOUNTER — Ambulatory Visit (INDEPENDENT_AMBULATORY_CARE_PROVIDER_SITE_OTHER): Payer: Commercial Managed Care - PPO | Admitting: Family Medicine

## 2021-09-24 VITALS — BP 128/74 | HR 72 | Ht 62.5 in | Wt 147.0 lb

## 2021-09-24 DIAGNOSIS — L608 Other nail disorders: Secondary | ICD-10-CM

## 2021-09-24 DIAGNOSIS — Z23 Encounter for immunization: Secondary | ICD-10-CM | POA: Diagnosis not present

## 2021-09-24 NOTE — Progress Notes (Signed)
Chief Complaint  Patient presents with   Nail Problem    Right great toe, thinks she may have toenail fungus.    2-3 months ago she noticed some discomfort in the right great toenail . She noticed discomfort at night, when laying on stomach.  Hasn't been wearing tight, closed-toe shoes.  No recollection of any trauma or injury.  2-3 weeks ago her nail polish was removed, she noticed discoloration. She has been using a tolnaftate OTC antifungal liquid around the edges of the nail/cuticle, and at the top/under the nail.  Hasn't noticed much difference. She removed her nail polish again today. No other issues with other nails.   PMH, PSH, SH reviewed  Outpatient Encounter Medications as of 09/24/2021  Medication Sig Note   escitalopram (LEXAPRO) 10 MG tablet TAKE 1 TABLET BY MOUTH DAILY    norethindrone-ethinyl estradiol (FEMHRT 1/5) 1-5 MG-MCG TABS tablet Take 1 tablet by mouth daily.    [DISCONTINUED] gabapentin (NEURONTIN) 300 MG capsule Take 1 capsule (300 mg total) by mouth at bedtime.    ALPRAZolam (XANAX) 0.5 MG tablet Take 0.5-1 tablets (0.25-0.5 mg total) by mouth 3 (three) times daily as needed for anxiety. (Patient not taking: Reported on 05/09/2021) 05/09/2021: As needed   fluorouracil (EFUDEX) 5 % cream Apply 1 application topically 2 (two) times daily. Reported on 05/10/2015 (Patient not taking: Reported on 04/06/2020) 05/09/2021: As needed   levocetirizine (XYZAL) 5 MG tablet Take 5 mg by mouth every evening. (Patient not taking: Reported on 07/19/2021) 09/24/2021: Uses in fall for allergies   triamcinolone (KENALOG) 0.1 % Apply 1 application topically 2 (two) times daily as needed (rash, itching). Apply sparingly to affected area of skin twice daily for up to 2 weeks (Patient not taking: Reported on 04/19/2020) 05/09/2021: As needed   No facility-administered encounter medications on file as of 09/24/2021.   No Known Allergies  ROS: no fever, chills, URI symptoms, or other concerns.   Toe discomfort has improved, persistent nail discoloration, per HPI.   PHYSICAL EXAM:   BP 128/74   Pulse 72   Ht 5' 2.5" (1.588 m)   Wt 147 lb (66.7 kg)   LMP  (Exact Date)   BMI 26.46 kg/m   Wt Readings from Last 3 Encounters:  09/24/21 147 lb (66.7 kg)  07/19/21 141 lb (64 kg)  05/09/21 150 lb 9.6 oz (68.3 kg)   Well-appearing, pleasant female, in no distress She is alert, oriented, cranial nerves grossly intact, normal gait. Normal mood, affect, hygiene, grooming. Extremities:  2+ pulses, no edema. There is white-yellow discoloration at the distal half of the great toenail.  The nail appears to be raised away from the nail bed, but not thickened.  The surrounding skin/cuticle area is all normal.  There is no tenderness at the toe, nor with compression of the toenail.   ASSESSMENT/PLAN:  Discolored nails - R great toenail  Need for shingles vaccine - counseled re: potential side effects. f/u 2 mos for the 2nd of the series - Plan: Zoster Recombinant (Shingrix )  Onycholysis vs onychomycosis. Suspect lysis--poss trauma that pushed the nailbed up, causing discomfort and discoloration. Discomfort has resolved.   No other toenails affected.  If it doesn't grow out, and/or if it spreads to other toenails, then may be fungal. Since asymptomatic at this time (and can keep the nail polished), will hold off on clipping nail and sending for culture, f/b treatment. To return for possible fungal culture if persistent/worsening, if treatment is desired.  All questions answered.

## 2021-09-24 NOTE — Patient Instructions (Signed)
Your right great toenail MAY have a fungal infection. However, given that it started with discomfort, and only affects 1 nail, it is possible that a separation of the nail from the base (from a mild trauma) could also be causing the discoloration.  Since the tenderness has improved, let's just observe for a while. Hopefully the nail will grow in normally (can take a year). If you notice further discoloration, thickening, or other nails becoming involved, then it is likely fungus, and we can decide whether you want to leave it alone (and keep it polished if cosmetically it bothers you), vs treating with an oral antifungal for 3 months.  If you would like confirmation about a fungus, return when the nail is a little longer, and not using an antifungal treatment, and we can send a clipping for fungal culture.

## 2021-10-22 ENCOUNTER — Encounter: Payer: Self-pay | Admitting: Family Medicine

## 2021-10-22 DIAGNOSIS — F411 Generalized anxiety disorder: Secondary | ICD-10-CM

## 2021-10-22 MED ORDER — ESCITALOPRAM OXALATE 20 MG PO TABS: 20.0000 mg | ORAL_TABLET | Freq: Every day | ORAL | 0 refills | Status: DC

## 2021-10-31 ENCOUNTER — Encounter: Payer: Self-pay | Admitting: Internal Medicine

## 2021-11-21 ENCOUNTER — Encounter: Payer: Self-pay | Admitting: *Deleted

## 2021-11-25 NOTE — Progress Notes (Deleted)
No chief complaint on file.    Patient sent message on 8/28: "I've started seeing a therapist to help me strategize some better ways to deal with my mom and my ex-husband. They have both created a significant amount of stress in my life with their behavior. Delma Officer is my therapist and she thinks I would do better with increasing my Lexapro to '20mg'$  while we work in this."  She was advised to increase to '15mg'$  for at least a week, then further titrate up to '20mg'$  dose. She is currently taking  She denies side effects.    ASSESSMENT/PLAN:

## 2021-11-26 ENCOUNTER — Ambulatory Visit: Payer: Commercial Managed Care - PPO | Admitting: Family Medicine

## 2021-11-30 ENCOUNTER — Other Ambulatory Visit: Payer: Self-pay | Admitting: Family Medicine

## 2021-11-30 DIAGNOSIS — N959 Unspecified menopausal and perimenopausal disorder: Secondary | ICD-10-CM

## 2021-12-30 NOTE — Progress Notes (Unsigned)
No chief complaint on file.  Patient presents for follow-up on anxiety, postmenopausal symptoms, and to get 2nd Shingrix vaccine.   Anxiety: Her lexapro dose was increased from '10mg'$ , titrated up to '20mg'$  in September per messages below. She just got back from vacation in [place].   Message from patient 8/28: "I've started seeing a therapist to help me strategize some better ways to deal with my mom and my ex-husband. They have both created a significant amount of stress in my life with their behavior. Delma Officer is my therapist and she thinks I would do better with increasing my Lexapro to '20mg'$  while we work in this."  She as advised: " I'd prefer that you take 1.5 tablets of the '10mg'$  for a week before switching to the '20mg'$  dose (you can do it at '15mg'$  for a little longer, if you have any persistent side effects as we increase the dose). I'll send in a prescription for the '20mg'$  tablet. I'd love to see you back for follow-up in about 6 weeks to see how you're doing on the higher dose, as well as follow up on the menopausal symptoms as well."  Postmenopausal symptoms:  Symptoms had been poor sleep (due to night sweats), vaginal dryness, more difficulty remembering names. The hot flashes during the day were mild/tolerable.  We had discussed various treatments, and ultimately she decided to start HRT preferring a combo tablet (over the estradiol patch/prometrium tablet). She was started on Fem-HRT in June 2023.  She had also been given gabapentin (to help with sleep issues, as well as benefit hot flashes).   UPDATE tried? Didn't need or SE?   PMH, PSH, SH reviewed   ROS: no f/c, chest pain, shortness of breath, URI symptoms. Anxiety Hot flashes, night sweats and vaginal dryness  Weight Just returned from a vacation in [place].   PHYSICAL EXAM:  LMP 06/18/2020 (Exact Date)   Wt Readings from Last 3 Encounters:  09/24/21 147 lb (66.7 kg)  07/19/21 141 lb (64 kg)  05/09/21 150 lb 9.6  oz (68.3 kg)    Well developed, well nourished patient, in no distress HEENT: conjunctiva and sclera are clear, EOMI Neck: No lymphadenopathy or thyromegaly Heart:  Regular rate and rhythm, no murmurs, rubs, gallops or ectopy Lungs:  Clear bilaterally, without wheezes, rales or ronchi Extremities:  No edema Neuro:  Alert and oriented, cranial nerves grossly intact. Normal gait. Psych:  Normal mood, affect, hygiene and grooming, normal speech, eye contact   ASSESSMENT/PLAN:  GAD-7  Shingrix #2

## 2021-12-31 ENCOUNTER — Ambulatory Visit (INDEPENDENT_AMBULATORY_CARE_PROVIDER_SITE_OTHER): Payer: Commercial Managed Care - PPO | Admitting: Family Medicine

## 2021-12-31 ENCOUNTER — Encounter: Payer: Self-pay | Admitting: Family Medicine

## 2021-12-31 VITALS — BP 112/68 | HR 60 | Ht 62.5 in | Wt 167.4 lb

## 2021-12-31 DIAGNOSIS — N959 Unspecified menopausal and perimenopausal disorder: Secondary | ICD-10-CM | POA: Diagnosis not present

## 2021-12-31 DIAGNOSIS — F411 Generalized anxiety disorder: Secondary | ICD-10-CM | POA: Diagnosis not present

## 2021-12-31 DIAGNOSIS — Z6830 Body mass index (BMI) 30.0-30.9, adult: Secondary | ICD-10-CM | POA: Diagnosis not present

## 2021-12-31 DIAGNOSIS — Z23 Encounter for immunization: Secondary | ICD-10-CM

## 2021-12-31 MED ORDER — ESCITALOPRAM OXALATE 20 MG PO TABS: 20.0000 mg | ORAL_TABLET | Freq: Every day | ORAL | 1 refills | Status: DC

## 2021-12-31 MED ORDER — NORETHINDRONE-ETH ESTRADIOL 1-5 MG-MCG PO TABS: 1.0000 | ORAL_TABLET | Freq: Every day | ORAL | 1 refills | Status: DC

## 2022-03-05 ENCOUNTER — Other Ambulatory Visit: Payer: Self-pay | Admitting: Family Medicine

## 2022-03-05 DIAGNOSIS — N959 Unspecified menopausal and perimenopausal disorder: Secondary | ICD-10-CM

## 2022-04-05 ENCOUNTER — Other Ambulatory Visit: Payer: Self-pay | Admitting: Family Medicine

## 2022-04-05 DIAGNOSIS — N959 Unspecified menopausal and perimenopausal disorder: Secondary | ICD-10-CM

## 2022-04-30 ENCOUNTER — Other Ambulatory Visit: Payer: Self-pay | Admitting: Family Medicine

## 2022-04-30 DIAGNOSIS — F411 Generalized anxiety disorder: Secondary | ICD-10-CM

## 2022-05-14 NOTE — Progress Notes (Unsigned)
No chief complaint on file.  Erin Porter is a 57 y.o. female who presents for a complete physical.   Anxiety: Her lexapro dose was increased from 10mg , titrated up to 20mg  in September, and her anxiety was significantly improved (GAD-7 score of 0 in 12/2021). She had reported working with a therapist Chief Technology Officer), and was no longer ruminating, nor filled with anxiety. Today she reports   Postmenopausal symptoms:  Prior to starting HRT (started Fem-HRT in 07/2021) her symptoms had been poor sleep (due to night sweats), vaginal dryness, more difficulty remembering names. The hot flashes during the day were mild/tolerable.  She continues to do well on HRT--sleeping better, no night sweats. No libido issues. Vaginal dryness improved. No abnormal bleeding.  H/o Vitamin D deficiency:  Level was normal at 45.6 in 03/2020, when doing [place] program (and not taking other vitamins).   She takes D3 only over the winter UPDATE IF TAKING*** Previously had been low at 28.6 when taking MVI daily, without separate D3.  She is currently taking  Obesity/overweight--she is very sensitive/defensive about her weight (was a problem in childhood, mother said terrible things to her). In November she reported being happy (overall, and with her weight), didn't want to try and achieve an unsustainable weight (she had lost a lot with [employer] in the past, got down to 140#; routine changed when her mother moved to [place] [facility]).  She really enjoys her glass of wine each evening.   Before I acknowledged or mentioned anything about her weight today, she was somewhat defensive in stating that she feels really good right now.  Moods are better, she is really happy. She enjoys her glass of wine each evening.     Immunization History  Administered Date(s) Administered   Influenza Split 11/03/2008, 12/05/2010, 11/15/2011   Influenza,inj,Quad PF,6+ Mos 11/17/2012, 03/01/2016, 12/20/2016   Influenza-Unspecified 12/27/2017,  01/01/2019, 03/11/2020, 01/24/2021, 11/16/2021   Moderna Covid-19 Vaccine Bivalent Booster 65yrs & up 01/24/2021   Moderna Sars-Covid-2 Vaccination 05/14/2019, 06/11/2019, 03/21/2020   Tdap 11/03/2008, 03/25/2018   Unspecified SARS-COV-2 Vaccination 11/24/2021   Zoster Recombinat (Shingrix) 09/24/2021, 12/31/2021   Last Pap smear: 03/2020 normal, no high risk HPV (also normal 02/2018; pap 02/2017--normal, but +HR HPV noted) Last mammogram: 06/2021 Last colonoscopy: 06/2019, sessile serrated polyp, advised 3 year f/u recommended (Dr. Silverio Decamp) Last DEXA: never Dentist: twice yearly Ophtho: yearly Sees dermatologist yearly Exercise:   Walking dogs a mile/day (20 minutes), but not very aerobic. No weight-bearing exercise.  Plans to resume walking with her neighbor when it is light in the mornings.  Lipids: Lab Results  Component Value Date   CHOL 164 04/12/2020   HDL 64 04/12/2020   LDLCALC 85 04/12/2020   TRIG 80 04/12/2020   CHOLHDL 2.6 04/12/2020     PMH, PSH, SH and FH reviewed and updated    ROS: The patient denies anorexia, fever, headaches,  vision changes, decreased hearing, ear pain, sore throat, breast concerns, chest pain, palpitations, dizziness, syncope, dyspnea on exertion, cough, swelling, nausea, vomiting, diarrhea, constipation, abdominal pain, melena, hematochezia, indigestion/heartburn, hematuria, dysuria, vaginal discharge, odor or itch, genital lesions, joint pains, numbness, tingling, weakness, tremor, rashes, depression, abnormal bleeding/bruising, or enlarged lymph nodes. Moods are good, anxiety is well controlled. Denies vaginal bleeding, hot flashes, night sweats, insomnia, vaginal dryness. No side effects/issues from HRT. Sees dermatologist regularly, denies skin concerns.  Some recent allergies, r/b xyzal prn.   PHYSICAL EXAM:  LMP 06/18/2020 (Exact Date)   Wt Readings from Last 3 Encounters:  12/31/21 167 lb 6.4 oz (75.9 kg)  09/24/21 147 lb (66.7  kg)  07/19/21 141 lb (64 kg)  04/19/19 weight 175# 9.6 oz  General Appearance:    Alert, cooperative, no distress, appears stated age  Head:    Normocephalic, without obvious abnormality, atraumatic  Eyes:    PERRL, conjunctiva/corneas clear, EOM's intact, fundi benign  Ears:    Normal TM's and external ear canals  Nose:   No drainage or sinus tenderness  Throat:   Normal mucosa  Neck:   Supple, no lymphadenopathy;  thyroid:  no enlargement/tenderness/ nodules; no carotid bruit or JVD  Back:    Spine nontender, no curvature, ROM normal, no CVA tenderness  Lungs:     Clear to auscultation bilaterally without wheezes, rales or ronchi; respirations unlabored  Chest Wall:    No tenderness or deformity   Heart:    Regular rate and rhythm, S1 and S2 normal, no murmur, rub or gallop  Breast Exam:    No tenderness, masses, or nipple discharge or inversion.  No axillary lymphadenopathy  Abdomen:     Soft, non-tender, nondistended, normoactive bowel sounds,  no masses, no hepatosplenomegaly  Genitalia:    Normal external genitalia without lesions.  BUS and vagina normal; no cervical motion tenderness, no cervical lesions. Uterus and adnexa not enlarged, nontender, no masses.  Pap not performed  Rectal:    Normal tone, no masses or tenderness; guaiac negative stool  Extremities:   No clubbing, cyanosis or edema  Pulses:   2+ and symmetric all extremities  Skin:   Skin color, texture, turgor normal, no lesions.    Lymph nodes:   Cervical, supraclavicular, inguinal and axillary nodes normal  Neurologic:   Normal strength, sensation and gait; reflexes 2+ and symmetric throughout                               Psych:   Normal mood, affect, hygiene and grooming.    ASSESSMENT/PLAN:  Did she bring form for work?  GAD-7 and PHQ-9 (vs 2) only if not doing well (these were normal in November, was doing great) Lexapro and HRT should be good until May? No refills needed today??  Cbc, c-met, lipids, D,  TSH  Discussed monthly self breast exams and yearly mammograms; at least 30 minutes of aerobic activity at least 5 days/week, weight-bearing exercise at least 2x/week; proper sunscreen use reviewed; healthy diet, including goals of calcium and vitamin D intake and alcohol recommendations (less than or equal to 1 drink/day) reviewed; regular seatbelt use; changing batteries in smoke detectors, carbon monoxide detectors.  Immunization recommendations discussed--UTD; continue yearly flu shots.     Colonoscopy due again 06/2022.   F/u 1 year, sooner prn

## 2022-05-14 NOTE — Patient Instructions (Signed)
  HEALTH MAINTENANCE RECOMMENDATIONS:  It is recommended that you get at least 30 minutes of aerobic exercise at least 5 days/week (for weight loss, you may need as much as 60-90 minutes). This can be any activity that gets your heart rate up. This can be divided in 10-15 minute intervals if needed, but try and build up your endurance at least once a week.  Weight bearing exercise is also recommended twice weekly.  Eat a healthy diet with lots of vegetables, fruits and fiber.  "Colorful" foods have a lot of vitamins (ie green vegetables, tomatoes, red peppers, etc).  Limit sweet tea, regular sodas and alcoholic beverages, all of which has a lot of calories and sugar.  Up to 1 alcoholic drink daily may be beneficial for women (unless trying to lose weight, watch sugars).  Drink a lot of water.  Calcium recommendations are 1200-1500 mg daily (1500 mg for postmenopausal women or women without ovaries), and vitamin D 1000 IU daily.  This should be obtained from diet and/or supplements (vitamins), and calcium should not be taken all at once, but in divided doses.  Monthly self breast exams and yearly mammograms for women over the age of 35 is recommended.  Sunscreen of at least SPF 30 should be used on all sun-exposed parts of the skin when outside between the hours of 10 am and 4 pm (not just when at beach or pool, but even with exercise, golf, tennis, and yard work!)  Use a sunscreen that says "broad spectrum" so it covers both UVA and UVB rays, and make sure to reapply every 1-2 hours.  Remember to change the batteries in your smoke detectors when changing your clock times in the spring and fall. Carbon monoxide detectors are recommended for your home.  Use your seat belt every time you are in a car, and please drive safely and not be distracted with cell phones and texting while driving.  Your colonoscopy is due again in May.  You should get notified from them directly, but if you don't, give them a  call in May.

## 2022-05-15 ENCOUNTER — Ambulatory Visit (INDEPENDENT_AMBULATORY_CARE_PROVIDER_SITE_OTHER): Payer: Commercial Managed Care - PPO | Admitting: Family Medicine

## 2022-05-15 ENCOUNTER — Encounter: Payer: Self-pay | Admitting: Family Medicine

## 2022-05-15 VITALS — BP 138/84 | HR 72 | Ht 62.5 in | Wt 165.0 lb

## 2022-05-15 DIAGNOSIS — Z0000 Encounter for general adult medical examination without abnormal findings: Secondary | ICD-10-CM

## 2022-05-15 DIAGNOSIS — R195 Other fecal abnormalities: Secondary | ICD-10-CM

## 2022-05-15 DIAGNOSIS — F411 Generalized anxiety disorder: Secondary | ICD-10-CM

## 2022-05-15 DIAGNOSIS — Z6829 Body mass index (BMI) 29.0-29.9, adult: Secondary | ICD-10-CM

## 2022-05-15 DIAGNOSIS — N959 Unspecified menopausal and perimenopausal disorder: Secondary | ICD-10-CM | POA: Diagnosis not present

## 2022-05-15 DIAGNOSIS — R030 Elevated blood-pressure reading, without diagnosis of hypertension: Secondary | ICD-10-CM

## 2022-05-15 DIAGNOSIS — E559 Vitamin D deficiency, unspecified: Secondary | ICD-10-CM

## 2022-05-15 MED ORDER — NORETHINDRONE-ETH ESTRADIOL 1-5 MG-MCG PO TABS: 1.0000 | ORAL_TABLET | Freq: Every day | ORAL | 3 refills | Status: DC

## 2022-05-21 ENCOUNTER — Other Ambulatory Visit: Payer: Commercial Managed Care - PPO

## 2022-05-21 DIAGNOSIS — E559 Vitamin D deficiency, unspecified: Secondary | ICD-10-CM

## 2022-05-21 DIAGNOSIS — Z0000 Encounter for general adult medical examination without abnormal findings: Secondary | ICD-10-CM

## 2022-05-22 LAB — CBC WITH DIFFERENTIAL/PLATELET
Basophils Absolute: 0.1 10*3/uL (ref 0.0–0.2)
Basos: 2 %
EOS (ABSOLUTE): 0.1 10*3/uL (ref 0.0–0.4)
Eos: 2 %
Hematocrit: 38.2 % (ref 34.0–46.6)
Hemoglobin: 12.6 g/dL (ref 11.1–15.9)
Immature Grans (Abs): 0 10*3/uL (ref 0.0–0.1)
Immature Granulocytes: 0 %
Lymphocytes Absolute: 1.8 10*3/uL (ref 0.7–3.1)
Lymphs: 34 %
MCH: 29.6 pg (ref 26.6–33.0)
MCHC: 33 g/dL (ref 31.5–35.7)
MCV: 90 fL (ref 79–97)
Monocytes Absolute: 0.5 10*3/uL (ref 0.1–0.9)
Monocytes: 8 %
Neutrophils Absolute: 2.9 10*3/uL (ref 1.4–7.0)
Neutrophils: 54 %
Platelets: 268 10*3/uL (ref 150–450)
RBC: 4.26 x10E6/uL (ref 3.77–5.28)
RDW: 12.9 % (ref 11.7–15.4)
WBC: 5.4 10*3/uL (ref 3.4–10.8)

## 2022-05-22 LAB — VITAMIN D 25 HYDROXY (VIT D DEFICIENCY, FRACTURES): Vit D, 25-Hydroxy: 33.6 ng/mL (ref 30.0–100.0)

## 2022-05-22 LAB — COMPREHENSIVE METABOLIC PANEL
ALT: 11 IU/L (ref 0–32)
AST: 17 IU/L (ref 0–40)
Albumin/Globulin Ratio: 2 (ref 1.2–2.2)
Albumin: 4.3 g/dL (ref 3.8–4.9)
Alkaline Phosphatase: 54 IU/L (ref 44–121)
BUN/Creatinine Ratio: 11 (ref 9–23)
BUN: 13 mg/dL (ref 6–24)
Bilirubin Total: 0.5 mg/dL (ref 0.0–1.2)
CO2: 22 mmol/L (ref 20–29)
Calcium: 8.7 mg/dL (ref 8.7–10.2)
Chloride: 102 mmol/L (ref 96–106)
Creatinine, Ser: 1.15 mg/dL — ABNORMAL HIGH (ref 0.57–1.00)
Globulin, Total: 2.2 g/dL (ref 1.5–4.5)
Glucose: 101 mg/dL — ABNORMAL HIGH (ref 70–99)
Potassium: 4.3 mmol/L (ref 3.5–5.2)
Sodium: 141 mmol/L (ref 134–144)
Total Protein: 6.5 g/dL (ref 6.0–8.5)
eGFR: 57 mL/min/{1.73_m2} — ABNORMAL LOW (ref >59)

## 2022-05-22 LAB — LIPID PANEL
Chol/HDL Ratio: 3 ratio (ref 0.0–4.4)
Cholesterol, Total: 173 mg/dL (ref 100–199)
HDL: 57 mg/dL (ref >39)
LDL Chol Calc (NIH): 89 mg/dL (ref 0–99)
Triglycerides: 155 mg/dL — ABNORMAL HIGH (ref 0–149)
VLDL Cholesterol Cal: 27 mg/dL (ref 5–40)

## 2022-05-22 LAB — TSH: TSH: 1.5 u[IU]/mL (ref 0.450–4.500)

## 2022-05-28 ENCOUNTER — Other Ambulatory Visit: Payer: Self-pay | Admitting: Family Medicine

## 2022-05-28 DIAGNOSIS — Z1231 Encounter for screening mammogram for malignant neoplasm of breast: Secondary | ICD-10-CM

## 2022-07-02 ENCOUNTER — Other Ambulatory Visit: Payer: Self-pay | Admitting: Podiatry

## 2022-07-02 ENCOUNTER — Ambulatory Visit (INDEPENDENT_AMBULATORY_CARE_PROVIDER_SITE_OTHER): Payer: Commercial Managed Care - PPO

## 2022-07-02 ENCOUNTER — Ambulatory Visit (INDEPENDENT_AMBULATORY_CARE_PROVIDER_SITE_OTHER): Payer: Commercial Managed Care - PPO | Admitting: Podiatry

## 2022-07-02 DIAGNOSIS — M79674 Pain in right toe(s): Secondary | ICD-10-CM

## 2022-07-02 DIAGNOSIS — L608 Other nail disorders: Secondary | ICD-10-CM

## 2022-07-02 DIAGNOSIS — M898X9 Other specified disorders of bone, unspecified site: Secondary | ICD-10-CM | POA: Diagnosis not present

## 2022-07-02 NOTE — Progress Notes (Signed)
Subjective:   Patient ID: Sharlene Dory, female   DOB: 57 y.o.   MRN: [id number]   HPI Chief Complaint  Patient presents with   Nail Problem    Rm 12 Right great hallux abnormal growth.    Ingrown Toenail    Rm 12 Right hallux ingrown pain and nail discoloration. Bilateral hallux. Pt complains of pain in the entire nail bed when putting pressure on the nail. No drainage or bleeding.      57 year old female presents the office with above concerns.  She says she gets pain on the right big toe this is ongoing for some time.  She saw her primary care provider last summer and she said the symptoms got worse and they have not improved.  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  No swelling redness or any drainage.  No other treatment.  No other concerns.  Review of Systems  All other systems reviewed and are negative.  Past Medical History:  Diagnosis Date   Allergic rhinitis, cause unspecified (seasonal and perennial)-ragweed, Dr.Whelan   Anxiety    Basal cell carcinoma of nose 09/2012   Dr. 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 (Patient not taking: Reported on 05/09/2021), Disp: 30 tablet, Rfl: 0   escitalopram (LEXAPRO) 20 MG tablet, Take 1 tablet (20 mg total) by mouth daily. TAKE 1 TABLET BY MOUTH DAILY, Disp: 90 tablet, Rfl: 1   fluorouracil (EFUDEX) 5 % cream, Apply 1 application topically 2 (two) times daily. Reported on 05/10/2015 (Patient not taking: Reported on  04/06/2020), Disp: , Rfl:    levocetirizine (XYZAL) 5 MG tablet, Take 5 mg by mouth every evening., Disp: , Rfl:    norethindrone-ethinyl estradiol (JINTELI) 1-5 MG-MCG TABS tablet, Take 1 tablet by mouth daily., Disp: 90 tablet, Rfl: 3   triamcinolone (KENALOG) 0.1 %, Apply 1 application topically 2 (two) times daily as needed (rash, itching). Apply sparingly to affected area of skin twice daily for up to 2 weeks (Patient not taking: Reported on 04/19/2020), Disp: 45 g, Rfl: 0  No Known Allergies        Objective:  Physical Exam  General: AAO x3, NAD  Dermatological: Right hallux nail is raised from the nailbed distally and is quite curved and almost a pincer nail type fashion.  Nails mildly discolored with yellow-brown discoloration but not significantly hypertrophic.  There is no edema, erythema or signs of infection.  Vascular: Dorsalis Pedis artery and Posterior Tibial artery pedal pulses are 2/4 bilateral with immedate capillary fill time. There is no pain with calf compression, swelling, warmth, erythema.   Neruologic: Grossly intact via light touch bilateral.   Musculoskeletal: Tenderness to palpation on the hallux toenail upon direct pressure of the nail.  There is no other areas of discomfort at this time.  Muscular strength 5/5 in all groups tested bilateral.  Gait: Unassisted, Nonantalgic.       Assessment:  57 year old female with hallux toenail, bone spur distal phalanx     Plan:  -Treatment options discussed including all alternatives, risks, and complications -Etiology of symptoms were discussed -X-rays were obtained and reviewed with the patient.  3 views of the foot were obtained.  On lateral view there is spurring of the distal phalanx. -I do think her symptoms are coming more from the underlying bone spur causing a deformity of the toenail.  We discussed different treatment options for nail as well as for the underlying bone spur.  After discussion regards to  multiple options I recommended to proceed with exostectomy, removal of the toenail.  After discussion she wished to proceed with this. -The incision placement as well as the postoperative course was discussed with the patient. I discussed risks of the surgery which include, but not limited to, infection, bleeding, pain, swelling, need for further surgery, delayed or nonhealing, painful or ugly scar, numbness or sensation changes, over/under correction, recurrence, transfer lesions, further deformity,  DVT/PE, loss of toe/foot. Patient understands these risks and wishes to proceed with surgery. The surgical consent was reviewed with the patient all 3 pages were signed. No promises or guarantees were given to the outcome of the procedure. All questions were answered to the best of my ability. Before the surgery the patient was encouraged to call the office if there is any further questions. The surgery will be performed at the [facility] on an outpatient basis.  No follow-ups on file.  Vivi Barrack DPM

## 2022-07-02 NOTE — Patient Instructions (Addendum)
Pre-Operative Instructions  Congratulations, you have decided to take an important step to improving your quality of life.  You can be assured that the doctors of [facility] will be with you every step of the way.  Plan to be at the surgery center/hospital at least 1 (one) hour prior to your scheduled time unless otherwise directed by the surgical center/hospital staff.  You must have a responsible adult accompany you, remain during the surgery and drive you home.  Make sure you have directions to the surgical center/hospital and know how to get there on time. For hospital based surgery you will need to obtain a history and physical form from your family physician within 1 month prior to the date of surgery- we will give you a form for you primary physician.  We make every effort to accommodate the date you request for surgery.  There are however, times where surgery dates or times have to be moved.  We will contact you as soon as possible if a change in schedule is required.   No Aspirin/Ibuprofen for one week before surgery.  If you are on aspirin, any non-steroidal anti-inflammatory medications (Mobic, Aleve, Ibuprofen) you should stop taking it 7 days prior to your surgery.  You make take Tylenol  For pain prior to surgery.  Medications- If you are taking daily heart and blood pressure medications, seizure, reflux, allergy, asthma, anxiety, pain or diabetes medications, make sure the surgery center/hospital is aware before the day of surgery so they may notify you which medications to take or avoid the day of surgery. No food or drink after midnight the night before surgery unless directed otherwise by surgical center/hospital staff. No alcoholic beverages 24 hours prior to surgery.  No smoking 24 hours prior to or 24 hours after surgery. Wear loose pants or shorts- loose enough to fit over bandages, boots, and casts. No slip on shoes, sneakers are best. Bring your boot with you to the  surgery center/hospital.  Also bring crutches or a walker if your physician has prescribed it for you.  If you do not have this equipment, it will be provided for you after surgery. If you have not been contracted by the surgery center/hospital by the day before your surgery, call to confirm the date and time of your surgery. Leave-time from work may vary depending on the type of surgery you have.  Appropriate arrangements should be made prior to surgery with your employer. Prescriptions will be provided immediately following surgery by your doctor.  Have these filled as soon as possible after surgery and take the medication as directed. Remove nail polish on the operative foot. Wash the night before surgery.  The night before surgery wash the foot and leg well with the antibacterial soap provided and water paying special attention to beneath the toenails and in between the toes.  Rinse thoroughly with water and dry well with a towel.  Perform this wash unless told not to do so by your physician.  Enclosed: 1 Ice pack (please put in freezer the night before surgery)   1 Hibiclens skin cleaner   Pre-op Instructions  If you have any questions regarding the instructions, do not hesitate to call our office at any point during this process.   [place]: [address] [place], [place] [address] [phone]  [place]: [address] [place], [place] [address] [phone]  Dr. Shivali Quackenbush, DPM  

## 2022-07-18 ENCOUNTER — Ambulatory Visit
Admission: RE | Admit: 2022-07-18 | Discharge: 2022-07-18 | Disposition: A | Discharge status: Home / Self Care | Payer: Commercial Managed Care - PPO | Source: Ambulatory Visit | Attending: Family Medicine | Admitting: Family Medicine

## 2022-07-18 DIAGNOSIS — Z1231 Encounter for screening mammogram for malignant neoplasm of breast: Secondary | ICD-10-CM

## 2022-07-23 ENCOUNTER — Telehealth: Payer: Self-pay | Admitting: Urology

## 2022-07-23 NOTE — Telephone Encounter (Signed)
DOS - 08/14/22  EXOSTECTOMY RIGHT --- 16109 NAIL REMOVAL 1ST RIGHT --- 11730  [facility] EFFECTIVE DATE - 02/25/22  DEDUCTIBLE - $2,000.00 W/ $1,558.05 REMAINING OOP - $5,500.00 W/ $6,045.40 REMAINING COINSURANCE - 20%  SPOKE WITH SALLY WITH [facility] AND SHE STATED THAT FOR CPT CODES 98119 AND 11730 NO PRIOR AUTH IS REQUIRED.  CALL REF # [id number]

## 2022-08-12 ENCOUNTER — Other Ambulatory Visit: Payer: Self-pay | Admitting: Family Medicine

## 2022-08-12 DIAGNOSIS — F411 Generalized anxiety disorder: Secondary | ICD-10-CM

## 2022-08-14 ENCOUNTER — Other Ambulatory Visit: Payer: Self-pay | Admitting: Podiatry

## 2022-08-14 DIAGNOSIS — M25774 Osteophyte, right foot: Secondary | ICD-10-CM | POA: Diagnosis not present

## 2022-08-14 MED ORDER — PROMETHAZINE HCL 25 MG PO TABS: 25.0000 mg | ORAL_TABLET | Freq: Three times a day (TID) | ORAL | 0 refills | Status: DC | PRN

## 2022-08-14 MED ORDER — CEPHALEXIN 500 MG PO CAPS: 500.0000 mg | ORAL_CAPSULE | Freq: Three times a day (TID) | ORAL | 0 refills | Status: DC

## 2022-08-14 MED ORDER — HYDROCODONE-ACETAMINOPHEN 5-325 MG PO TABS: 1.0000 | ORAL_TABLET | ORAL | 0 refills | Status: DC | PRN

## 2022-08-14 NOTE — Progress Notes (Signed)
Postop medications sent 

## 2022-08-19 ENCOUNTER — Encounter: Payer: Self-pay | Admitting: Podiatry

## 2022-08-19 ENCOUNTER — Ambulatory Visit (INDEPENDENT_AMBULATORY_CARE_PROVIDER_SITE_OTHER): Payer: Commercial Managed Care - PPO

## 2022-08-19 ENCOUNTER — Ambulatory Visit (INDEPENDENT_AMBULATORY_CARE_PROVIDER_SITE_OTHER): Payer: Commercial Managed Care - PPO | Admitting: Podiatry

## 2022-08-19 DIAGNOSIS — M898X9 Other specified disorders of bone, unspecified site: Secondary | ICD-10-CM

## 2022-08-19 DIAGNOSIS — Z9889 Other specified postprocedural states: Secondary | ICD-10-CM

## 2022-08-21 NOTE — Progress Notes (Signed)
Subjective:  Chief Complaint  Patient presents with   Routine Post Op    POV #1 DOS 08/14/2022 RT BIG TOE REMOVAL OF BONE SPUR, REMOVAL OF BIG TOENAIL   "I haven't had any pain"    Erin Porter is a 57 y.o. is seen today in office s/p the above procedures.  States that she been doing well and not been any pain since surgery.  Left.  She is not planning fevers.  She is concerned with the pain of the concerns.  No injuries.    Objective: General: No acute distress, AAOx3  DP/PT pulses palpable 2/4, CRT < 3 sec to all digits.  Protective sensation intact. Motor function intact.  Right foot: Incision is well coapted without any evidence of dehiscence with suture intact. There is no surrounding erythema, ascending cellulitis, fluctuance, crepitus, malodor, drainage/purulence. There is minimal edema around the surgical site. There is no pain along the surgical site.  No other areas of tenderness to bilateral lower extremities.  No other open lesions or pre-ulcerative lesions.  No pain with calf compression, swelling, warmth, erythema.   Assessment and Plan:  Status post right hallux bone spur excision, doing well with no complications   -Treatment options discussed including all alternatives, risks, and complications -X-rays were obtained reviewed of the right foot.  3 views of the foot were obtained.  Status post exostectomy of the distal phalanx of the hallux.  No acute fracture. -Incisions healing well.  Xeroform applied followed by dressing.  Discussed that she can wash with soap and water, dry thoroughly and apply a similar bandage with antibiotic ointment. -Remain in surgical shoe -Ice/elevation -Pain medication as needed. -Monitor for any clinical signs or symptoms of infection and DVT/PE and directed to call the office immediately should any occur or go to the ER. -Follow-up as scheduled for possible suture removal or sooner if any problems arise. In the meantime, encouraged to  call the office with any questions, concerns, change in symptoms.   Ovid Curd, DPM

## 2022-08-27 ENCOUNTER — Encounter: Payer: Commercial Managed Care - PPO | Admitting: Podiatry

## 2022-09-02 ENCOUNTER — Other Ambulatory Visit: Payer: Self-pay | Admitting: Podiatry

## 2022-09-02 ENCOUNTER — Ambulatory Visit (INDEPENDENT_AMBULATORY_CARE_PROVIDER_SITE_OTHER): Payer: Commercial Managed Care - PPO | Admitting: Podiatry

## 2022-09-02 ENCOUNTER — Ambulatory Visit (INDEPENDENT_AMBULATORY_CARE_PROVIDER_SITE_OTHER): Payer: Commercial Managed Care - PPO

## 2022-09-02 DIAGNOSIS — Z9889 Other specified postprocedural states: Secondary | ICD-10-CM

## 2022-09-03 NOTE — Progress Notes (Signed)
Subjective:   Patient ID: Erin Porter, female   DOB: 57 y.o.   MRN: [id number]   HPI Patient presents for removal of stitches right states she is doing well and states that she has not had any drainage or other pathology with surgery   ROS      Objective:  Physical Exam  Neurovascular status intact negative Denna Haggard' sign noted wound edges well coapted stitches intact nail bed itself looks healthy     Assessment:  Doing well post subungual exostectomy right along with removal of nail with stitches intact     Plan:  Stitch removal undertaken final x-ray reviewed gradual return to soft shoe Band-Aid applied continue to be careful with this for the next few weeks  X-rays indicate satisfactory resection of bone spur good alignment noted on x-ray review

## 2022-09-05 ENCOUNTER — Encounter: Payer: Commercial Managed Care - PPO | Admitting: Podiatry

## 2022-09-05 IMAGING — MG MM DIGITAL SCREENING BILAT W/ TOMO AND CAD
8 series · 9 of 24 positions shown · non-contrast
Comparison: Previous exam(s).

CLINICAL DATA: Screening.

EXAM:
DIGITAL SCREENING BILATERAL MAMMOGRAM WITH TOMOSYNTHESIS AND CAD
TECHNIQUE: Bilateral screening digital craniocaudal and mediolateral oblique
mammograms were obtained. Bilateral screening digital breast
tomosynthesis was performed. The images were evaluated with
computer-aided detection.

[R CC synth-2D]
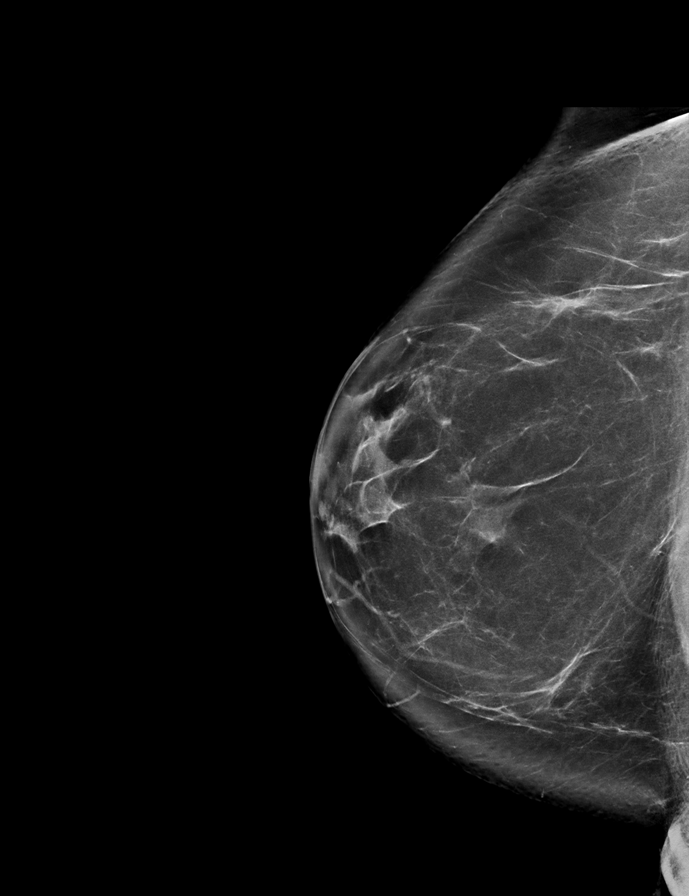

[L MLO synth-2D]
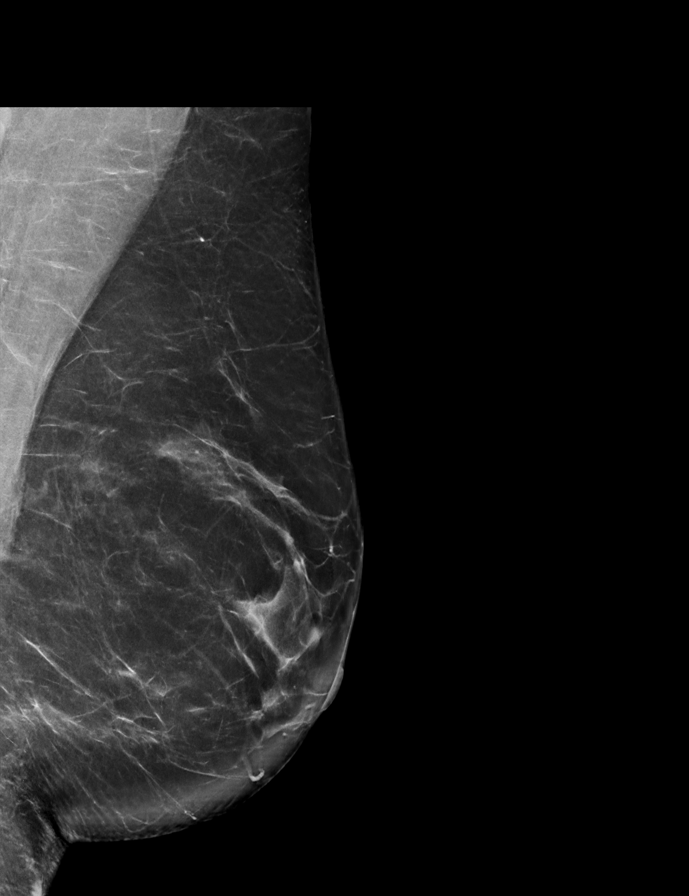

[L CC synth-2D]
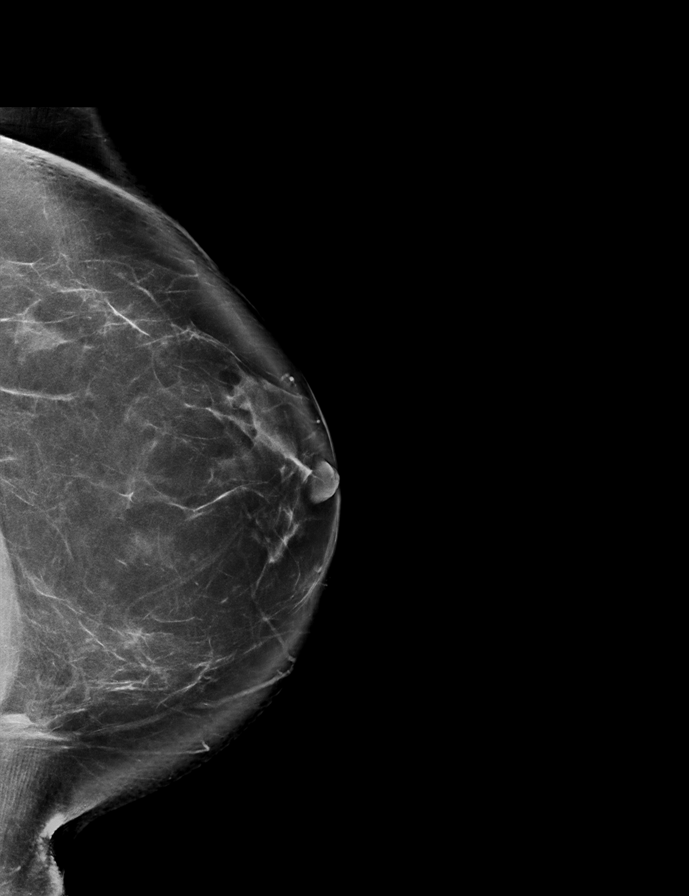

[R MLO synth-2D]
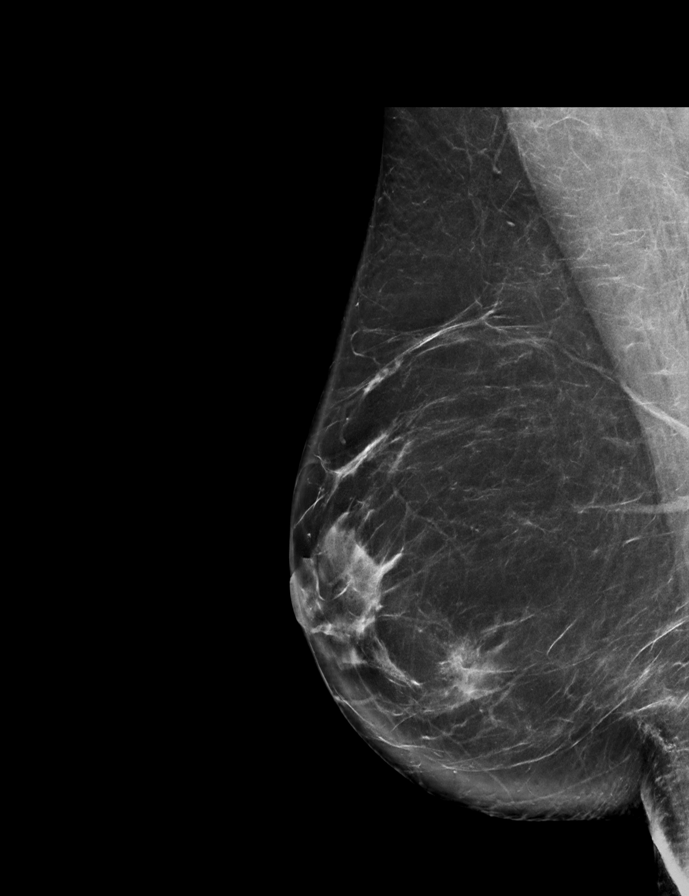

[R MLO tomo · 2 of 84 frames shown]
[frame 28/84]
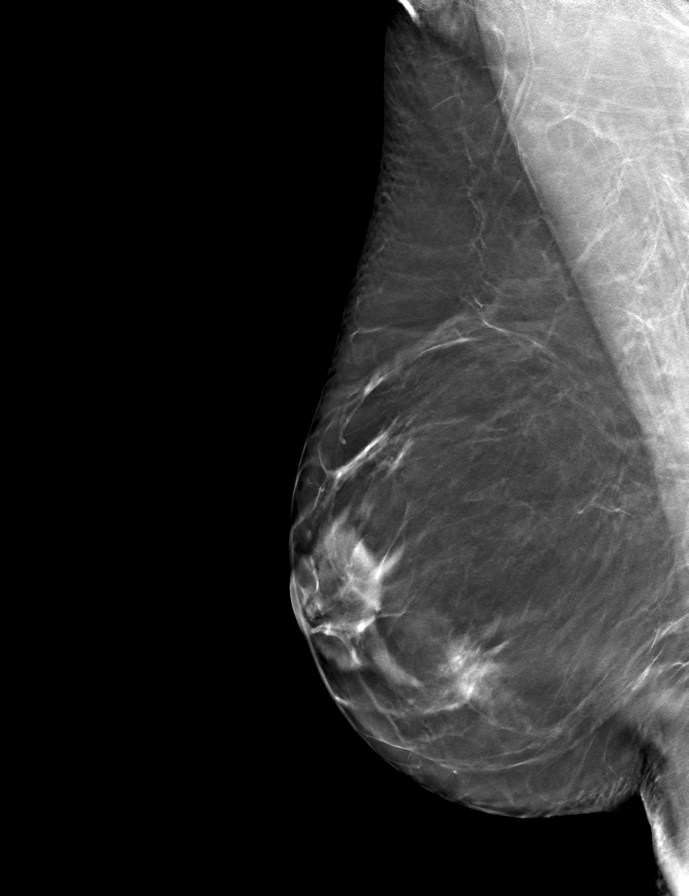
[frame 43/84]
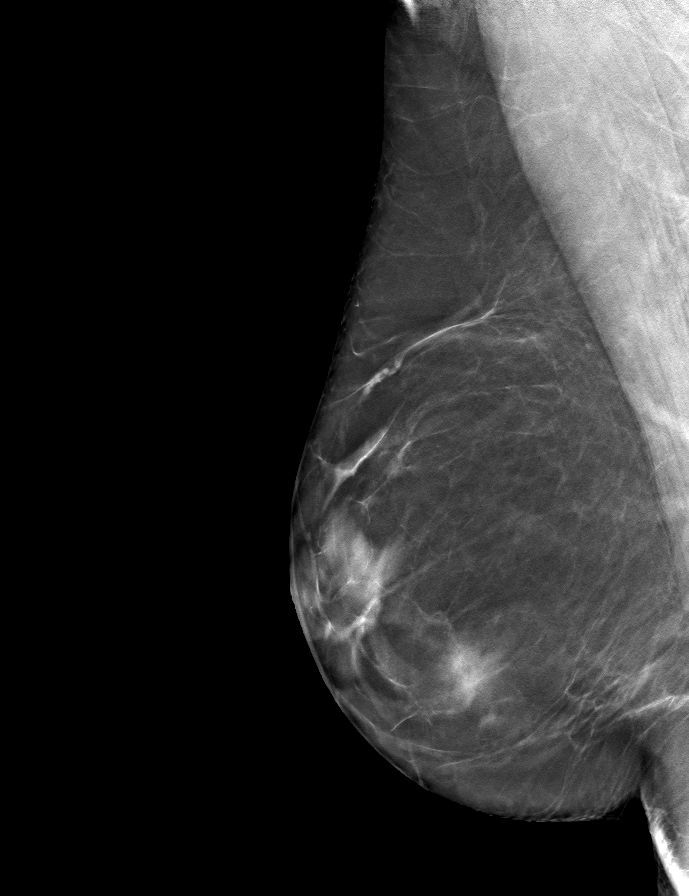

[R CC tomo · tomo slice 44/87.0]
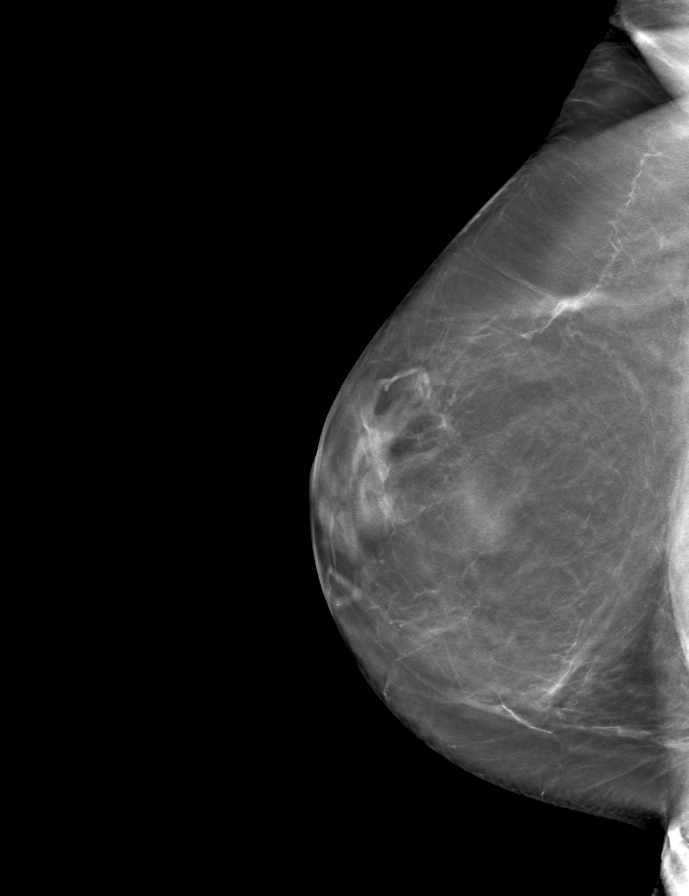

[L MLO tomo · tomo slice 44/87.0]
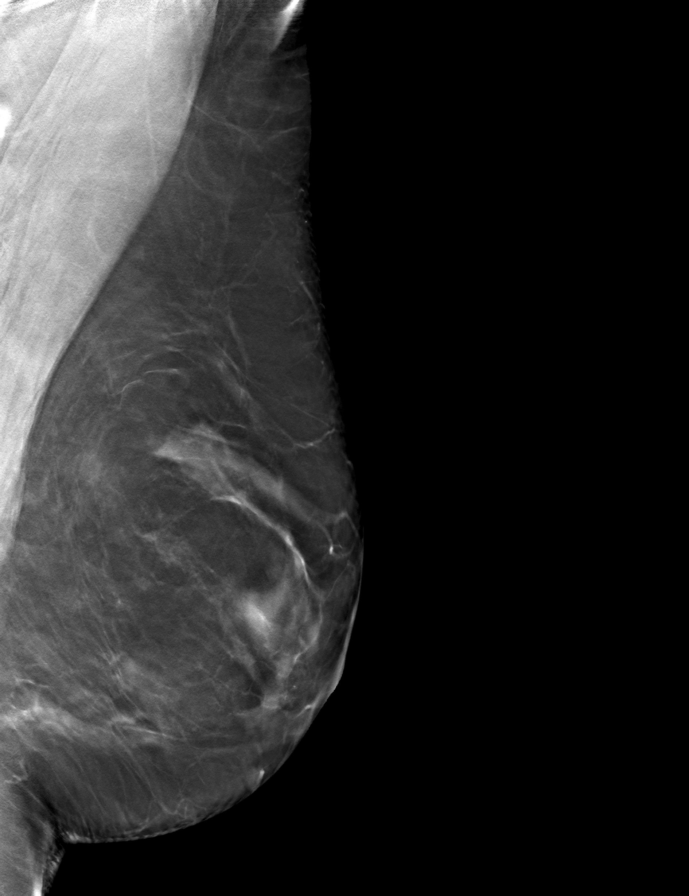

[L CC tomo · tomo slice 46/91.0]
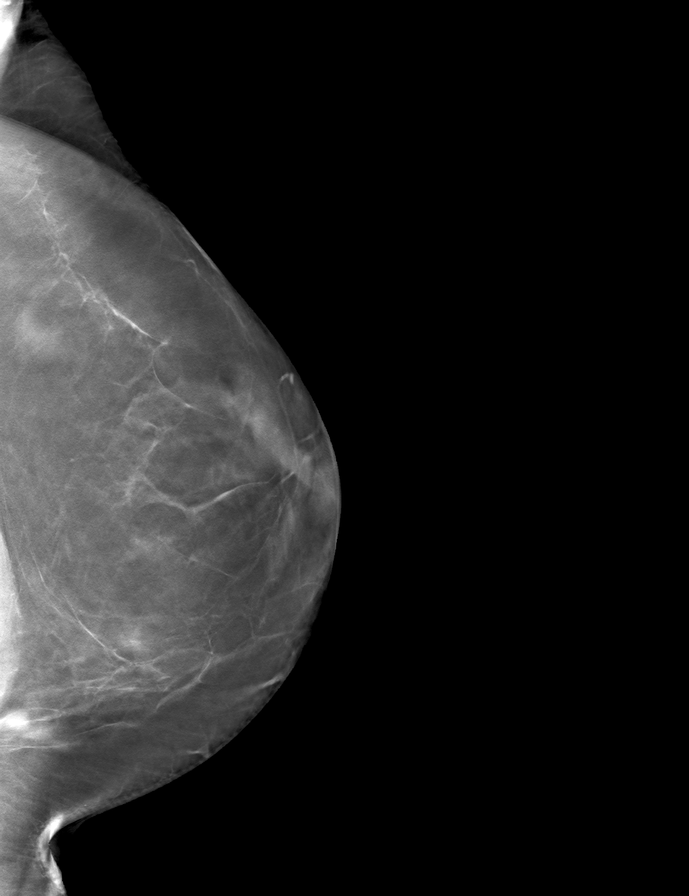

[9 of 24 positions shown; findings below may reference images not displayed]

ACR Breast Density Category b: There are scattered areas of
fibroglandular density.
FINDINGS: There are no findings suspicious for malignancy.
IMPRESSION: No mammographic evidence of malignancy. A result letter of this
screening mammogram will be mailed directly to the patient.

RECOMMENDATION:
Screening mammogram in one year. (Code:51-O-LD2)

BI-RADS CATEGORY  1: Negative.

## 2022-09-12 ENCOUNTER — Ambulatory Visit: Payer: Commercial Managed Care - PPO

## 2022-09-12 ENCOUNTER — Ambulatory Visit (INDEPENDENT_AMBULATORY_CARE_PROVIDER_SITE_OTHER): Payer: Commercial Managed Care - PPO | Admitting: Podiatry

## 2022-09-12 DIAGNOSIS — M79671 Pain in right foot: Secondary | ICD-10-CM | POA: Diagnosis not present

## 2022-09-12 DIAGNOSIS — M898X9 Other specified disorders of bone, unspecified site: Secondary | ICD-10-CM

## 2022-09-12 DIAGNOSIS — L608 Other nail disorders: Secondary | ICD-10-CM

## 2022-09-12 NOTE — Patient Instructions (Signed)
You can use UREA NAIL GEL on the toenail and also I would start a BIOTIN supplement or a vitamin for hair, skin and nails.

## 2022-09-16 NOTE — Progress Notes (Signed)
Subjective:  Chief Complaint  Patient presents with   Routine Post Op    POV #3 DOS 08/14/2022 RT BIG TOE REMOVAL OF BONE SPUR, REMOVAL OF BIG TOENAIL    Erin Porter is a 57 y.o. is seen today in office s/p the above procedures.  Says that she is doing well and I am in any pain and she is very happy with the outcome of the surgery.  No recent injury or changes.   Objective: General: No acute distress, AAOx3  DP/PT pulses palpable 2/4, CRT < 3 sec to all digits.  Protective sensation intact. Motor function intact.  Right foot: Incision is well coapted without any evidence of dehiscence.  Still minimal scab is present.  There is no pain.  No send edema is no erythema.  No drainage of pus.  Toenail is hypertrophic and No other open lesions or pre-ulcerative lesions.  No pain with calf compression, swelling, warmth, erythema.   Assessment and Plan:  Status post right hallux bone spur excision, doing well with no complications   -Treatment options discussed including all alternatives, risks, and complications -Overall she is doing well she is back to normal activities wearing regular shoes.  Continue normal activities as tolerated and gradually increase activity as tolerated.  Can use urea nail gel on the thicker toenail.  -Will discharge from postoperative course and she agrees with this.  Encouraged following questions or concerns or any changes.  Vivi Barrack DPM

## 2022-09-26 ENCOUNTER — Ambulatory Visit (INDEPENDENT_AMBULATORY_CARE_PROVIDER_SITE_OTHER): Payer: Commercial Managed Care - PPO | Admitting: Podiatry

## 2022-09-26 DIAGNOSIS — L600 Ingrowing nail: Secondary | ICD-10-CM

## 2022-09-26 DIAGNOSIS — L608 Other nail disorders: Secondary | ICD-10-CM

## 2022-09-26 MED ORDER — CEPHALEXIN 500 MG PO CAPS: 500.0000 mg | ORAL_CAPSULE | Freq: Three times a day (TID) | ORAL | 0 refills | Status: DC

## 2022-09-26 NOTE — Patient Instructions (Signed)

## 2022-09-30 NOTE — Progress Notes (Signed)
Subjective: Chief Complaint  Patient presents with   Toe Pain    RIGHT HALLUX SX ON 6/19, PT IS HAVE PAIN, RED SWOLLEN, DENIES DRAINAGE, VERY SORE TO TOUCH, STARTED YESTERDAY, PAIN LEVEL 3.NO INJURY    Erin Porter is a 57 y.o. is seen today in office today with above concerns.  She states that over the last day she is having swelling redness of the toenail is starting to get worse.  No drainage or pus.  Objective: General: No acute distress, AAOx3  DP/PT pulses palpable 2/4, CRT < 3 sec to all digits.  Protective sensation intact. Motor function intact.  Right foot: Incision is well coapted without any evidence of dehiscence.  There is incurvation of the hallux toenail and there is localized edema and erythema mostly along the toenail itself along the hallux IPJ.  No erythema on the plantar aspect of toe.  Tenderness is along the toenail itself along the corners. No other open lesions or pre-ulcerative lesions.  No pain with calf compression, swelling, warmth, erythema.   Assessment and Plan:  Ingrown toenail localized erythema  -Treatment options discussed including all alternatives, risks, and complications -At this time, open partial nail removal without chemical matricectomy to the right hallux toenail, and she agrees to proceed. Risks and complications were discussed with the patient for which they understand and  verbally consent to the procedure. Under sterile conditions a total of 3 mL of a mixture of 2% lidocaine plain and 0.5% Marcaine plain was infiltrated in a hallux block fashion. Once anesthetized, the skin was prepped in sterile fashion. A tourniquet was then applied.  Next the right hallux nail was removed in total making to remove all nail borders.  Once the nail was removed, the area was debrided and the underlying skin was intact. The area was irrigated and hemostasis was obtained.  A dry sterile dressing was applied. After application of the dressing the tourniquet was  removed and there is found to be an immediate capillary refill time to the digit. The patient tolerated the procedure well any complications. Post procedure instructions were discussed the patient for which he verbally understood. Follow-up in one week for nail check or sooner if any problems are to arise. Discussed signs/symptoms of worsening infection and directed to call the office immediately should any occur or go directly to the emergency room. In the meantime, encouraged to call the office with any questions, concerns, changes symptoms.  Vivi Barrack DPM

## 2022-10-01 ENCOUNTER — Encounter: Payer: Self-pay | Admitting: Podiatry

## 2022-10-14 ENCOUNTER — Ambulatory Visit: Payer: Commercial Managed Care - PPO | Admitting: Podiatry

## 2022-10-14 DIAGNOSIS — B3731 Acute candidiasis of vulva and vagina: Secondary | ICD-10-CM | POA: Insufficient documentation

## 2022-10-14 DIAGNOSIS — L600 Ingrowing nail: Secondary | ICD-10-CM | POA: Diagnosis not present

## 2022-10-14 DIAGNOSIS — B9689 Other specified bacterial agents as the cause of diseases classified elsewhere: Secondary | ICD-10-CM | POA: Insufficient documentation

## 2022-10-14 DIAGNOSIS — F53 Postpartum depression: Secondary | ICD-10-CM | POA: Insufficient documentation

## 2022-10-14 DIAGNOSIS — N760 Acute vaginitis: Secondary | ICD-10-CM | POA: Insufficient documentation

## 2022-10-14 NOTE — Progress Notes (Signed)
Subjective: Chief Complaint  Patient presents with   Ingrown Toenail    Nail check Pt stated that it is doing better     Erin Porter is a 57 y.o. is seen today in office today with above concerns.  She states the nail is doing better she denies any pain.  She does not keep a bandage on there is no drainage.  She is back to normal activities without any discomfort.  No other concerns.   Objective: General: No acute distress, AAOx3  DP/PT pulses palpable 2/4, CRT < 3 sec to all digits.  Protective sensation intact. Motor function intact.  Scars well from surgery.  Status post nail removal.  Small scab is still present but there is no drainage or pus.  There is no surrounding erythema, ascending cellulitis.  There is no fluctuation or crepitation.  No malodor.  There is no pain on exam. No other open lesions or pre-ulcerative lesions.  No pain with calf compression, swelling, warmth, erythema.   Assessment and Plan:  Ingrown toenail localized erythema  -Treatment options discussed including all alternatives, risks, and complications -Overall doing much better.  There is a few.  Discussed monitoring for any signs or symptoms of infection we also discussed that as the nail starts to grow then it starts to come and thick or discolored any issues to let me know. -Monitor for any clinical signs or symptoms of infection and directed to call the office immediately should any occur or go to the ER.  No follow-ups on file.  Vivi Barrack DPM

## 2022-10-22 ENCOUNTER — Encounter: Payer: Self-pay | Admitting: Podiatry

## 2022-11-06 ENCOUNTER — Encounter: Payer: Commercial Managed Care - PPO | Admitting: Family Medicine

## 2022-11-08 ENCOUNTER — Other Ambulatory Visit: Payer: Self-pay | Admitting: Family Medicine

## 2022-11-08 DIAGNOSIS — F411 Generalized anxiety disorder: Secondary | ICD-10-CM

## 2022-11-08 NOTE — Telephone Encounter (Signed)
Last apt 05/15/22

## 2022-11-08 NOTE — Telephone Encounter (Signed)
Pt called and she is leaving to go OOT for 2 weeks , leaving tomorrow morning early

## 2022-11-28 ENCOUNTER — Encounter: Payer: Self-pay | Admitting: Gastroenterology

## 2022-11-28 ENCOUNTER — Encounter: Payer: Self-pay | Admitting: Podiatry

## 2023-01-13 ENCOUNTER — Encounter: Payer: Self-pay | Admitting: *Deleted

## 2023-01-16 ENCOUNTER — Encounter: Payer: Self-pay | Admitting: Gastroenterology

## 2023-01-28 ENCOUNTER — Encounter: Payer: Self-pay | Admitting: Medical

## 2023-01-28 ENCOUNTER — Telehealth: Payer: Commercial Managed Care - PPO | Admitting: Medical

## 2023-01-28 VITALS — Ht 62.5 in | Wt 165.0 lb

## 2023-01-28 DIAGNOSIS — J988 Other specified respiratory disorders: Secondary | ICD-10-CM

## 2023-01-28 DIAGNOSIS — J3489 Other specified disorders of nose and nasal sinuses: Secondary | ICD-10-CM | POA: Diagnosis not present

## 2023-01-28 DIAGNOSIS — H109 Unspecified conjunctivitis: Secondary | ICD-10-CM | POA: Diagnosis not present

## 2023-01-28 DIAGNOSIS — R058 Other specified cough: Secondary | ICD-10-CM | POA: Diagnosis not present

## 2023-01-28 MED ORDER — AMOXICILLIN 875 MG PO TABS: 875.0000 mg | ORAL_TABLET | Freq: Two times a day (BID) | ORAL | 0 refills | Status: AC

## 2023-01-28 MED ORDER — ERYTHROMYCIN 5 MG/GM OP OINT: 1.0000 | TOPICAL_OINTMENT | Freq: Every day | OPHTHALMIC | 0 refills | Status: DC

## 2023-01-28 NOTE — Progress Notes (Signed)
Subjective:     Patient ID: Erin Porter, female   DOB: 08-Mar-1965, 57 y.o.   MRN: [id number]  This visit type was conducted due to national recommendations for restrictions regarding the COVID-19 Pandemic (e.g. social distancing) in an effort to limit this patient's exposure and mitigate transmission in our community.  Due to their co-morbid illnesses, this patient is at least at moderate risk for complications without adequate follow up.  This format is felt to be most appropriate for this patient at this time.    Documentation for virtual audio and video telecommunications through [facility] encounter:  The patient was located at home. The provider was located in the office. The patient did consent to this visit and is aware of possible charges through their insurance for this visit.  The other persons participating in this telemedicine service were none. Time spent on call was 20 minutes and in review of previous records 20 minutes total.  This virtual service is not related to other E/M service within previous 7 days.   HPI Chief Complaint  Patient presents with   Cough    VIRTUAL cough/congestion, stuffy ears started last Wed. Has had a very sore throat. Has left eye discharge that started two days ago. Also red and irritated. Has not had any covid tests.    Virtual today for consult.  Husband had illness about 2 weeks ago, then she picked it up from him.  Her symptoms started about a week ago.   She notes sore throat, stuffy ears, congested head, cough, runny nose.   2 days ago had lots of drainage from left eye.   Today eye is red, matted shut this morning.  Having some yellow discharge from eye.    No fever, no body aches or chills.  Cough is productive of yellow phlegm and has yellow nasal drainage.    Not seeing any real improvement.  Been using mucinex day/night liquid gels.  Has used some warm compresses for eye.    Had flu vaccine a month ago.    No other  aggravating or relieving factors. No other complaint.    Past Medical History:  Diagnosis Date   Allergic rhinitis, cause unspecified (seasonal and perennial)-ragweed, Dr.Whelan   Anxiety    Basal cell carcinoma of nose 09/2012   Dr. 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   Ht 5' 2.5" (1.588 m)   Wt 165 lb (74.8 kg)   LMP 06/18/2020 (Exact Date)   BMI 29.70 kg/m   Gen: wd, wn, nad No obvious labored breathing or wheezing Answers questions appropriately Sounds somewhat congested Mildly ill-appearing Left upper eyelid with some mild swelling, conjunctive injected with erythema, right appears normal        Assessment:     Encounter Diagnoses  Name Primary?   Purulent nasal discharge Yes   Cough productive of purulent sputum    Respiratory tract infection    Conjunctivitis of left eye, unspecified conjunctivitis type        Plan:     We discussed her symptoms, treatment recommendations as below.  Advise continue rest, continue the Mucinex day/night multi symptom relief  a few more days at least.  Add in salt water gargles and nasal saline flush.  Begin amoxicillin oral antibiotic.  If not much improved within the next 3 to 4 days then let me know.  Discussed diagnosis of conjunctivitis/pink eye.  Begin Romycin ointment.  Discussed proper use of ointment.  Advised that pink eye is very contagious and spreads by direct contact.  Discussed treatment including moist compresses, avoid rubbing eyes, do not wear contact lenses or makeup until infection is resolved.  Discussed prevention, hand washing, not rubbing eyes.  Avoid using contact lenses until this is cleared  up  Patient was advised to call or return if worse or not improving in the next few days.     Erin "Meg" was seen today for cough.  Diagnoses and all orders for this visit:  Purulent nasal discharge  Cough productive of purulent sputum  Respiratory tract infection  Conjunctivitis of left eye, unspecified conjunctivitis type  Other orders -     amoxicillin (AMOXIL) 875 MG tablet; Take 1 tablet (875 mg total) by mouth 2 (two) times daily for 10 days. -     erythromycin ophthalmic ointment; Place 1 Application into the left eye at bedtime.  F/u prn

## 2023-02-07 ENCOUNTER — Encounter: Payer: Commercial Managed Care - PPO | Admitting: Gastroenterology

## 2023-02-21 ENCOUNTER — Other Ambulatory Visit: Payer: Self-pay | Admitting: Medical

## 2023-02-21 ENCOUNTER — Encounter: Payer: Self-pay | Admitting: Internal Medicine

## 2023-02-21 ENCOUNTER — Telehealth: Payer: Self-pay | Admitting: Family Medicine

## 2023-02-21 DIAGNOSIS — F411 Generalized anxiety disorder: Secondary | ICD-10-CM

## 2023-02-21 MED ORDER — ESCITALOPRAM OXALATE 20 MG PO TABS
20.0000 mg | ORAL_TABLET | Freq: Every day | ORAL | 0 refills | Status: DC
Start: 2023-02-21 — End: 2023-03-24

## 2023-02-21 NOTE — Telephone Encounter (Signed)
Pt called in today to schedule her cpe for march. I have refilled for 90 days

## 2023-02-21 NOTE — Telephone Encounter (Signed)
She scheduled physical for March. Please change the lexapro to a 90d

## 2023-02-21 NOTE — Telephone Encounter (Signed)
Pt called in about Lexapro refill, she is scheduled for CPE in March, if we can do 90 day supply instead of 30, that will get her thru to her CPE appt

## 2023-03-07 ENCOUNTER — Ambulatory Visit (AMBULATORY_SURGERY_CENTER): Payer: Commercial Managed Care - PPO

## 2023-03-07 VITALS — Ht 63.0 in | Wt 165.0 lb

## 2023-03-07 DIAGNOSIS — Z860100 Personal history of colon polyps, unspecified: Secondary | ICD-10-CM

## 2023-03-07 MED ORDER — NA SULFATE-K SULFATE-MG SULF 17.5-3.13-1.6 GM/177ML PO SOLN: 1.0000 | Freq: Once | ORAL | 0 refills | Status: AC

## 2023-03-07 NOTE — Progress Notes (Signed)

## 2023-03-16 ENCOUNTER — Encounter: Payer: Self-pay | Admitting: Certified Registered Nurse Anesthetist

## 2023-03-20 ENCOUNTER — Encounter: Payer: Self-pay | Admitting: Gastroenterology

## 2023-03-24 ENCOUNTER — Ambulatory Visit: Payer: Commercial Managed Care - PPO | Admitting: Gastroenterology

## 2023-03-24 ENCOUNTER — Encounter: Payer: Self-pay | Admitting: Gastroenterology

## 2023-03-24 ENCOUNTER — Other Ambulatory Visit: Payer: Self-pay | Admitting: Family Medicine

## 2023-03-24 VITALS — BP 124/72 | HR 62 | Temp 98.0°F | Resp 14

## 2023-03-24 DIAGNOSIS — K648 Other hemorrhoids: Secondary | ICD-10-CM | POA: Diagnosis not present

## 2023-03-24 DIAGNOSIS — Z860100 Personal history of colon polyps, unspecified: Secondary | ICD-10-CM

## 2023-03-24 DIAGNOSIS — Z1211 Encounter for screening for malignant neoplasm of colon: Secondary | ICD-10-CM | POA: Diagnosis present

## 2023-03-24 DIAGNOSIS — D125 Benign neoplasm of sigmoid colon: Secondary | ICD-10-CM | POA: Diagnosis not present

## 2023-03-24 DIAGNOSIS — Z860101 Personal history of adenomatous and serrated colon polyps: Secondary | ICD-10-CM

## 2023-03-24 DIAGNOSIS — F411 Generalized anxiety disorder: Secondary | ICD-10-CM

## 2023-03-24 MED ORDER — SODIUM CHLORIDE 0.9 % IV SOLN: 500.0000 mL | Freq: Once | INTRAVENOUS | Status: DC

## 2023-03-24 NOTE — Progress Notes (Unsigned)
[facility] History and Physical   Primary Care Physician:  Joselyn Arrow, MD   Reason for Procedure:  History of adenomatous colon polyps  Plan:    Surveillance colonoscopy with possible interventions as needed     HPI: Erin Porter is a very pleasant 58 y.o. female here for surveillance colonoscopy. Denies any nausea, vomiting, abdominal pain, melena or bright red blood per rectum  The risks and benefits as well as alternatives of endoscopic procedure(s) have been discussed and reviewed. All questions answered. The patient agrees to proceed.    Past Medical History:  Diagnosis Date   Allergic rhinitis, cause unspecified (seasonal and perennial)-ragweed, Dr.Whelan   Anxiety    Basal cell carcinoma of nose 09/2012   Dr. 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 Authorizing Provider  Biotin 08657 MCG TABS Take 1 tablet by mouth daily.   Yes [provider]  calcium-vitamin D (OSCAL WITH D) 500-5 MG-MCG tablet Take 1 tablet by mouth.   Yes [provider]  escitalopram (LEXAPRO) 20 MG tablet Take 1 tablet (20 mg total) by mouth daily. 02/21/23  Yes Joselyn Arrow, MD  levocetirizine (XYZAL) 5 MG tablet Take 5 mg by mouth every evening.   Yes [provider]  Magnesium 80 MG TABS Take by mouth.   Yes [provider]  Multiple Vitamin (MULTIVITAMIN) capsule Take 1 capsule by mouth daily.   Yes [provider]  norethindrone-ethinyl estradiol (JINTELI) 1-5 MG-MCG TABS tablet Take 1 tablet by mouth daily. 05/15/22  Yes Joselyn Arrow, MD  fluorouracil (EFUDEX) 5 % cream  Apply 1 application topically 2 (two) times daily. Reported on 05/10/2015 Patient not taking: Reported on 04/06/2020    [provider]    Current Outpatient Medications  Medication Sig Dispense Refill   Biotin 84696 MCG TABS Take 1 tablet by mouth daily.     calcium-vitamin D (OSCAL WITH D) 500-5 MG-MCG tablet Take 1 tablet by mouth.     escitalopram (LEXAPRO) 20 MG tablet Take 1 tablet (20 mg total) by mouth daily. 90 tablet 0   levocetirizine (XYZAL) 5 MG tablet Take 5 mg by mouth every evening.     Magnesium 80 MG TABS Take by mouth.     Multiple Vitamin (MULTIVITAMIN) capsule Take 1 capsule by mouth daily.     norethindrone-ethinyl estradiol (JINTELI) 1-5 MG-MCG TABS tablet Take 1 tablet by mouth daily. 90 tablet 3   fluorouracil (EFUDEX) 5 % cream Apply 1 application topically 2 (two) times daily. Reported on 05/10/2015 (Patient not taking: Reported on 04/06/2020)     Current Facility-Administered Medications  Medication Dose Route Frequency Provider Last Rate Last Admin   0.9 %  sodium chloride infusion  500 mL Intravenous Once Napoleon Form, MD        Allergies as of 03/24/2023   (No Known Allergies)    Family History  Problem Relation Age of Onset   Allergies Mother    Obesity Mother    Atrial fibrillation Mother    Pulmonary embolism Mother  also had COVID, poss had PE prior   Hypertension Father    Cancer Father        skin   Heart attack Maternal Grandmother 2       s/p CABG   Heart disease Maternal Grandmother        MI in 83's, s/p CABG   Dementia Maternal Grandmother    Diabetes Maternal Grandfather    Stroke Maternal Grandfather    Colon cancer Neg Hx    Colon polyps Neg Hx    Esophageal cancer Neg Hx    Rectal cancer Neg Hx    Stomach cancer Neg Hx     Social History   Socioeconomic History   Marital status: Married    Spouse name: Darren   Number of children: 2   Years of education: Not on file   Highest education level: Not  on file  Occupational History   Occupation: accounting  Tobacco Use   Smoking status: Never   Smokeless tobacco: Never  Vaping Use   Vaping status: Never Used  Substance and Sexual Activity   Alcohol use: Yes    Alcohol/week: 3.0 - 5.0 standard drinks of alcohol    Types: 3 - 5 Glasses of wine per week    Comment: 3 glasses of wine/week   Drug use: No   Sexual activity: Yes    Comment: husband had vasectomy  Other Topics Concern   Not on file  Social History Narrative   Divorced.   Lives with 2 sons (50/50 with their father), 2 dogs and her husband, Trey Paula.    Oldest is at [employer] and doing apprenticeship.   Corliss Parish is graduating, has a job with [employer].   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   Mother moved in 08/2020 with her 3 dogs live in a cottage for her on their property.   Social Drivers of Corporate investment banker Strain: Not on file  Food Insecurity: Not on file  Transportation Needs: Not on file  Physical Activity: Not on file  Stress: Not on file  Social Connections: Not on file  Intimate Partner Violence: Not on file    Review of Systems:  All other review of systems negative except as mentioned in the HPI.  Physical Exam: Vital signs in last 24 hours: BP (!) 130/90   Pulse 68   Temp 98 F (36.7 C)   LMP 06/18/2020 (Exact Date)   SpO2 99%  General:   Alert, NAD Lungs:  Clear .   Heart:  Regular rate and rhythm Abdomen:  Soft, nontender and nondistended. Neuro/Psych:  Alert and cooperative. Normal mood and affect. A and O x 3  Reviewed labs, radiology imaging, old records and pertinent past GI work up  Patient is appropriate for planned procedure(s) and anesthesia in an ambulatory setting   K. Scherry Ran , MD [phone]

## 2023-03-24 NOTE — Patient Instructions (Signed)
Resume previous diet Continue present medications Await pathology results Repeat colonoscopy in 5 years  Handouts/information given for polyps and hemorrhoids  YOU HAD AN ENDOSCOPIC PROCEDURE TODAY AT THE [facility]:   Refer to the procedure report that was given to you for any specific questions about what was found during the examination.  If the procedure report does not answer your questions, please call your gastroenterologist to clarify.  If you requested that your care partner not be given the details of your procedure findings, then the procedure report has been included in a sealed envelope for you to review at your convenience later.  YOU SHOULD EXPECT: Some feelings of bloating in the abdomen. Passage of more gas than usual.  Walking can help get rid of the air that was put into your GI tract during the procedure and reduce the bloating. If you had a lower endoscopy (such as a colonoscopy or flexible sigmoidoscopy) you may notice spotting of blood in your stool or on the toilet paper. If you underwent a bowel prep for your procedure, you may not have a normal bowel movement for a few days.  Please Note:  You might notice some irritation and congestion in your nose or some drainage.  This is from the oxygen used during your procedure.  There is no need for concern and it should clear up in a day or so.  SYMPTOMS TO REPORT IMMEDIATELY:  Following lower endoscopy (colonoscopy):  Excessive amounts of blood in the stool  Significant tenderness or worsening of abdominal pains  Swelling of the abdomen that is new, acute  Fever of 100F or higher  For urgent or emergent issues, a gastroenterologist can be reached at any hour by calling [phone]. Do not use MyChart messaging for urgent concerns.   DIET:  We do recommend a small meal at first, but then you may proceed to your regular diet.  Drink plenty of fluids but you should avoid alcoholic beverages for 24  hours.  ACTIVITY:  You should plan to take it easy for the rest of today and you should NOT DRIVE or use heavy machinery until tomorrow (because of the sedation medicines used during the test).    FOLLOW UP: Our staff will call the number listed on your records the next business day following your procedure.  We will call around 7:15- 8:00 am to check on you and address any questions or concerns that you may have regarding the information given to you following your procedure. If we do not reach you, we will leave a message.     If any biopsies were taken you will be contacted by phone or by letter within the next 1-3 weeks.  Please call us at [phone] if you have not heard about the biopsies in 3 weeks.   SIGNATURES/CONFIDENTIALITY: You and/or your care partner have signed paperwork which will be entered into your electronic medical record.  These signatures attest to the fact that that the information above on your After Visit Summary has been reviewed and is understood.  Full responsibility of the confidentiality of this discharge information lies with you and/or your care-partner.

## 2023-03-24 NOTE — Progress Notes (Unsigned)
Pt's states no medical or surgical changes since previsit or office visit.

## 2023-03-24 NOTE — Progress Notes (Unsigned)
Report given to PACU, vss

## 2023-03-24 NOTE — Progress Notes (Signed)
Called to room to assist during endoscopic procedure.  Patient ID and intended procedure confirmed with present staff. Received instructions for my participation in the procedure from the performing physician.

## 2023-03-24 NOTE — Op Note (Signed)
[facility] Patient Name: Erin Porter Procedure Date: 03/24/2023 9:23 AM MRN: [id number] Endoscopist: Napoleon Form , MD, [id number] Age: 59 Referring MD:  Date of Birth: 1965/10/04 Gender: Female Account #: [id number] Procedure:                Colonoscopy Indications:              High risk colon cancer surveillance: Personal                            history of colonic polyps, High risk colon cancer                            surveillance: Personal history of adenoma (10 mm or                            greater in size) Medicines:                Monitored Anesthesia Care Procedure:                Pre-Anesthesia Assessment:                           - Prior to the procedure, a History and Physical                            was performed, and patient medications and                            allergies were reviewed. The patient's tolerance of                            previous anesthesia was also reviewed. The risks                            and benefits of the procedure and the sedation                            options and risks were discussed with the patient.                            All questions were answered, and informed consent                            was obtained. Prior Anticoagulants: The patient has                            taken no anticoagulant or antiplatelet agents. ASA                            Grade Assessment: II - A patient with mild systemic                            disease. After reviewing the risks and benefits,  the patient was deemed in satisfactory condition to                            undergo the procedure.                           After obtaining informed consent, the colonoscope                            was passed under direct vision. Throughout the                            procedure, the patient's blood pressure, pulse, and                            oxygen saturations were monitored  continuously. The                            [id number]-HQ190L Colonoscope [id number] was introduced                            through the anus and advanced to the the terminal                            ileum, with identification of the appendiceal                            orifice and IC valve. The colonoscopy was performed                            without difficulty. The patient tolerated the                            procedure well. The quality of the bowel                            preparation was good. The ileocecal valve,                            appendiceal orifice, and rectum were photographed. Scope In: 9:28:17 AM Scope Out: 9:38:53 AM Scope Withdrawal Time: 0 hours 8 minutes 14 seconds  Total Procedure Duration: 0 hours 10 minutes 36 seconds  Findings:                 The perianal and digital rectal examinations were                            normal.                           A 3 mm polyp was found in the sigmoid colon. The                            polyp was sessile. The polyp was removed with a  cold snare. Resection and retrieval were complete.                           Non-bleeding internal hemorrhoids were found during                            retroflexion. The hemorrhoids were small.                           The exam was otherwise without abnormality. Complications:            No immediate complications. Estimated Blood Loss:     Estimated blood loss was minimal. Impression:               - One 3 mm polyp in the sigmoid colon, removed with                            a cold snare. Resected and retrieved.                           - Non-bleeding internal hemorrhoids.                           - The examination was otherwise normal. Recommendation:           - Patient has a contact number available for                            emergencies. The signs and symptoms of potential                            delayed complications were discussed with  the                            patient. Return to normal activities tomorrow.                            Written discharge instructions were provided to the                            patient.                           - Resume previous diet.                           - Continue present medications.                           - Await pathology results.                           - Repeat colonoscopy in 5 years for surveillance. Napoleon Form, MD 03/24/2023 9:48:40 AM This report has been signed electronically.

## 2023-03-25 ENCOUNTER — Other Ambulatory Visit: Payer: Self-pay | Admitting: Family Medicine

## 2023-03-25 ENCOUNTER — Telehealth: Payer: Self-pay | Admitting: *Deleted

## 2023-03-25 DIAGNOSIS — N959 Unspecified menopausal and perimenopausal disorder: Secondary | ICD-10-CM

## 2023-03-25 MED ORDER — ESCITALOPRAM OXALATE 20 MG PO TABS: 20.0000 mg | ORAL_TABLET | Freq: Every day | ORAL | 0 refills | Status: DC

## 2023-03-25 NOTE — Telephone Encounter (Signed)
Is this okay to refill?

## 2023-03-25 NOTE — Telephone Encounter (Signed)
Patient did not answer, will try again tomorrow.

## 2023-03-25 NOTE — Telephone Encounter (Signed)
It was written for #90 with 3RF in 04/2022. She should have enough to last until her visit, or at least until March, no?  If for whatever reason there is an issue, it would be to fill it just until her visit in March (ok for 90, no refills), not the year supply as is pended.

## 2023-03-25 NOTE — Telephone Encounter (Signed)
  Follow up Call-     03/24/2023    8:11 AM  Call back number  Post procedure Call Back phone  # [phone]  Permission to leave phone message Yes     Patient questions:  Do you have a fever, pain , or abdominal swelling? No. Pain Score  0 *  Have you tolerated food without any problems? Yes.    Have you been able to return to your normal activities? Yes.    Do you have any questions about your discharge instructions: Diet   No. Medications  No. Follow up visit  No.  Do you have questions or concerns about your Care? No.  Actions: * If pain score is 4 or above: No action needed, pain <4.

## 2023-03-27 LAB — SURGICAL PATHOLOGY

## 2023-04-22 ENCOUNTER — Other Ambulatory Visit: Payer: Self-pay | Admitting: Family Medicine

## 2023-04-22 DIAGNOSIS — N959 Unspecified menopausal and perimenopausal disorder: Secondary | ICD-10-CM

## 2023-04-22 NOTE — Telephone Encounter (Signed)
 Called pharmacy and there are no refills left. I asked how there was not a 30 day refill left when this rx was called in 05/15/22. They could not explain. [facility] it was trannsferred to their mail order system. She got a 90 day rx on 01/11/23.

## 2023-04-26 ENCOUNTER — Encounter: Payer: Self-pay | Admitting: Gastroenterology

## 2023-04-29 ENCOUNTER — Other Ambulatory Visit: Payer: Self-pay | Admitting: Family Medicine

## 2023-04-29 DIAGNOSIS — N959 Unspecified menopausal and perimenopausal disorder: Secondary | ICD-10-CM

## 2023-04-30 NOTE — Telephone Encounter (Signed)
 Called pharmacy and they have it filled.

## 2023-05-05 ENCOUNTER — Telehealth: Payer: Self-pay | Admitting: Family Medicine

## 2023-05-05 DIAGNOSIS — E559 Vitamin D deficiency, unspecified: Secondary | ICD-10-CM

## 2023-05-05 DIAGNOSIS — R7301 Impaired fasting glucose: Secondary | ICD-10-CM

## 2023-05-05 DIAGNOSIS — Z5181 Encounter for therapeutic drug level monitoring: Secondary | ICD-10-CM

## 2023-05-05 DIAGNOSIS — Z0000 Encounter for general adult medical examination without abnormal findings: Secondary | ICD-10-CM

## 2023-05-05 NOTE — Telephone Encounter (Signed)
 Copied from CRM [id number]. Topic: Appointments - Scheduling Inquiry for Clinic >> May 05, 2023  1:57 PM Clide Dales wrote: Patient would like to come in a few days prior to her physical to complete her labs so it can be discussed during her visit. No lab orders in chart. Can this be accommodated? Please advise.

## 2023-05-13 ENCOUNTER — Other Ambulatory Visit

## 2023-05-13 DIAGNOSIS — E559 Vitamin D deficiency, unspecified: Secondary | ICD-10-CM

## 2023-05-13 DIAGNOSIS — Z0000 Encounter for general adult medical examination without abnormal findings: Secondary | ICD-10-CM

## 2023-05-13 DIAGNOSIS — R7301 Impaired fasting glucose: Secondary | ICD-10-CM

## 2023-05-13 DIAGNOSIS — Z5181 Encounter for therapeutic drug level monitoring: Secondary | ICD-10-CM

## 2023-05-14 LAB — CBC WITH DIFFERENTIAL/PLATELET
Basophils Absolute: 0.1 10*3/uL (ref 0.0–0.2)
Basos: 1 %
EOS (ABSOLUTE): 0.1 10*3/uL (ref 0.0–0.4)
Eos: 3 %
Hematocrit: 38.7 % (ref 34.0–46.6)
Hemoglobin: 13.1 g/dL (ref 11.1–15.9)
Immature Grans (Abs): 0 10*3/uL (ref 0.0–0.1)
Immature Granulocytes: 0 %
Lymphocytes Absolute: 1.8 10*3/uL (ref 0.7–3.1)
Lymphs: 32 %
MCH: 30.1 pg (ref 26.6–33.0)
MCHC: 33.9 g/dL (ref 31.5–35.7)
MCV: 89 fL (ref 79–97)
Monocytes Absolute: 0.4 10*3/uL (ref 0.1–0.9)
Monocytes: 7 %
Neutrophils Absolute: 3.2 10*3/uL (ref 1.4–7.0)
Neutrophils: 57 %
Platelets: 315 10*3/uL (ref 150–450)
RBC: 4.35 x10E6/uL (ref 3.77–5.28)
RDW: 12.6 % (ref 11.7–15.4)
WBC: 5.5 10*3/uL (ref 3.4–10.8)

## 2023-05-14 LAB — COMPREHENSIVE METABOLIC PANEL
ALT: 10 IU/L (ref 0–32)
AST: 19 IU/L (ref 0–40)
Albumin: 4.2 g/dL (ref 3.8–4.9)
Alkaline Phosphatase: 57 IU/L (ref 44–121)
BUN/Creatinine Ratio: 13 (ref 9–23)
BUN: 15 mg/dL (ref 6–24)
Bilirubin Total: 0.4 mg/dL (ref 0.0–1.2)
CO2: 21 mmol/L (ref 20–29)
Calcium: 9.1 mg/dL (ref 8.7–10.2)
Chloride: 104 mmol/L (ref 96–106)
Creatinine, Ser: 1.13 mg/dL — ABNORMAL HIGH (ref 0.57–1.00)
Globulin, Total: 2.4 g/dL (ref 1.5–4.5)
Glucose: 101 mg/dL — ABNORMAL HIGH (ref 70–99)
Potassium: 3.9 mmol/L (ref 3.5–5.2)
Sodium: 141 mmol/L (ref 134–144)
Total Protein: 6.6 g/dL (ref 6.0–8.5)
eGFR: 57 mL/min/{1.73_m2} — ABNORMAL LOW (ref >59)

## 2023-05-14 LAB — HEMOGLOBIN A1C
Est. average glucose Bld gHb Est-mCnc: 108 mg/dL
Hgb A1c MFr Bld: 5.4 % (ref 4.8–5.6)

## 2023-05-14 LAB — VITAMIN D 25 HYDROXY (VIT D DEFICIENCY, FRACTURES): Vit D, 25-Hydroxy: 37.5 ng/mL (ref 30.0–100.0)

## 2023-05-19 ENCOUNTER — Encounter: Payer: Commercial Managed Care - PPO | Admitting: Family Medicine

## 2023-05-19 DIAGNOSIS — F411 Generalized anxiety disorder: Secondary | ICD-10-CM

## 2023-05-19 DIAGNOSIS — N959 Unspecified menopausal and perimenopausal disorder: Secondary | ICD-10-CM

## 2023-05-19 DIAGNOSIS — E559 Vitamin D deficiency, unspecified: Secondary | ICD-10-CM

## 2023-05-19 DIAGNOSIS — R7301 Impaired fasting glucose: Secondary | ICD-10-CM

## 2023-05-19 DIAGNOSIS — Z0000 Encounter for general adult medical examination without abnormal findings: Secondary | ICD-10-CM

## 2023-05-21 NOTE — Progress Notes (Unsigned)
 No chief complaint on file.  Erin Porter is a 58 y.o. female who presents for a complete physical. See below for labs done prior to visit.  Anxiety: Her lexapro dose was increased from 10mg , titrated up to 20mg  in September 2023, and her anxiety was significantly improved (GAD-7 score of 0 in 12/2021). She is still doing well. Stressors are related to her mother.  She continues to see a therapist Herbert Seta [phone]), which is helpful.   Postmenopausal symptoms:  Prior to starting HRT (started Fem-HRT in 07/2021) her symptoms had been poor sleep (due to night sweats), vaginal dryness, more difficulty remembering names. The hot flashes during the day were mild/tolerable.  She continues to do well on HRT--sleeping better, no night sweats. No libido issues. Vaginal dryness improved.  No abnormal bleeding.  H/o Vitamin D deficiency:  Level was 33.6 in 04/2022 when not taking D3 or MVI, just taking D3 over the winter (stopped with time change).  It had been normal at 45.6 in 03/2020, when doing [employer] program (and not taking other vitamins).    She takes D3 only over the winter, took a daily supplement until the recent time change.  No MVI or D3 currently.  Component Ref Range & Units (hover) 8 d ago (05/13/23) 1 yr ago (05/21/22) 3 yr ago (04/12/20) 4 yr ago (04/02/19) 6 yr ago (10/11/16) 8 yr ago (05/10/15) 8 yr ago (07/04/14)  Vit D, 25-Hydroxy 37.5 33.6 CM 45.6 CM 28.6 Low  CM 37 R, CM 26 Low  R, CM 26 Low  R, CM    Obesity/overweight--she is very sensitive/defensive about her weight (was a problem in childhood, mother said terrible things to her). She previously had lost a lot with [employer] in the past, got down to 140#; routine changed when her mother moved to [place]. She enjoys wine, had been having nightly, but had cut back. Doesn't have wine on the nights she goes to yoga after work (3-4 times/week). She tries to eat a healthy diet.   Immunization History  Administered Date(s) Administered    Influenza Split 11/03/2008, 12/05/2010, 11/15/2011   Influenza,inj,Quad PF,6+ Mos 11/17/2012, 03/01/2016, 12/20/2016   Influenza-Unspecified 12/27/2017, 01/01/2019, 03/11/2020, 01/24/2021, 11/16/2021, 01/11/2023   Moderna Covid-19 Vaccine Bivalent Booster 10yrs & up 01/24/2021   Moderna Sars-Covid-2 Vaccination 05/14/2019, 06/11/2019, 03/21/2020   Pfizer(Comirnaty)Fall Seasonal Vaccine 12 years and older 01/11/2023   Tdap 11/03/2008, 03/25/2018   Unspecified SARS-COV-2 Vaccination 11/24/2021   Zoster Recombinant(Shingrix) 09/24/2021, 12/31/2021   Last Pap smear: 03/2020 normal, no high risk HPV (also normal 02/2018; pap 02/2017--normal, but +HR HPV noted) Last mammogram: 06/2022 Last colonoscopy: 02/2023 with Dr. Lavon Paganini. 5 yr f/u recommended (had benign polyp, 5 yr rec based on prior polyps). Last DEXA: never Dentist: twice yearly Ophtho: yearly Sees dermatologist yearly Exercise:    yoga 3-4x/week (flow-style). Walking dogs a mile/day (20 minutes), but not very aerobic. No weight-bearing exercise.    Lipids: Lab Results  Component Value Date   CHOL 173 05/21/2022   HDL 57 05/21/2022   LDLCALC 89 05/21/2022   TRIG 155 (H) 05/21/2022   CHOLHDL 3.0 05/21/2022     PMH, PSH, SH and FH reviewed and updated    ROS: The patient denies anorexia, fever, headaches,  vision changes, decreased hearing, ear pain, sore throat, breast concerns, chest pain, palpitations, dizziness, syncope, dyspnea on exertion, cough, swelling, nausea, vomiting, diarrhea, constipation, abdominal pain, melena, hematochezia, indigestion/heartburn, hematuria, dysuria, vaginal discharge, odor or itch, genital lesions, joint pains, numbness, tingling, weakness,  tremor, rashes, depression, abnormal bleeding/bruising, or enlarged lymph nodes. Moods are good, anxiety is well controlled. Denies vaginal bleeding, hot flashes, night sweats, insomnia, vaginal dryness. No side effects/issues from HRT. Sees dermatologist  regularly, denies skin concerns. Some recent allergies, r/b xyzal prn.   PHYSICAL EXAM:  LMP 06/18/2020 (Exact Date)    Wt Readings from Last 3 Encounters:  03/07/23 165 lb (74.8 kg)  01/28/23 165 lb (74.8 kg)  05/15/22 165 lb (74.8 kg)  05/15/22 165 lb (74.8 kg) 04/19/19 175 kb 9.6 oz  General Appearance:    Alert, cooperative, no distress, appears stated age  Head:    Normocephalic, without obvious abnormality, atraumatic  Eyes:    PERRL, conjunctiva/corneas clear, EOM's intact, fundi benign  Ears:    Normal TM's and external ear canals  Nose:   No drainage or sinus tenderness  Throat:   Normal mucosa  Neck:   Supple, no lymphadenopathy;  thyroid:  no enlargement/tenderness/ nodules; no carotid bruit or JVD  Back:    Spine nontender, no curvature, ROM normal, no CVA tenderness  Lungs:     Clear to auscultation bilaterally without wheezes, rales or ronchi; respirations unlabored  Chest Wall:    No tenderness or deformity   Heart:    Regular rate and rhythm, S1 and S2 normal, no murmur, rub or gallop  Breast Exam:    No tenderness, masses, or nipple discharge or inversion.  No axillary lymphadenopathy  Abdomen:     Soft, non-tender, nondistended, normoactive bowel sounds,  no masses, no hepatosplenomegaly  Genitalia:    Normal external genitalia without lesions.  BUS and vagina normal; no cervical motion tenderness, no cervical lesions. Uterus and adnexa not enlarged, nontender, no masses.  Pap not performed  Rectal:    Exam deferred, recent colonoscopy.  Extremities:   No clubbing, cyanosis or edema  Pulses:   2+ and symmetric all extremities  Skin:   Skin color, texture, turgor normal, no lesions.    Lymph nodes:   Cervical, supraclavicular, inguinal and axillary nodes normal  Neurologic:   Normal strength, sensation and gait; reflexes 2+ and symmetric throughout                              Psych:   Normal mood, affect, hygiene and grooming.      05/15/2022    2:19 PM  07/19/2021   11:21 AM 05/09/2021    2:59 PM 04/19/2020    1:33 PM 04/06/2020    3:42 PM  Depression screen PHQ 2/9  Decreased Interest 0 0 0 0 0  Down, Depressed, Hopeless 0 0 0 0 0  PHQ - 2 Score 0 0 0 0 0  Altered sleeping 0  0    Tired, decreased energy 0  0    Change in appetite 0  0    Feeling bad or failure about yourself  0  0    Trouble concentrating 0  0    Moving slowly or fidgety/restless 0  0    Suicidal thoughts 0  0    PHQ-9 Score 0  0    Difficult doing work/chores Not difficult at all  Not difficult at all        05/15/2022    2:18 PM 12/31/2021    3:57 PM 07/19/2021   11:21 AM 05/09/2021    2:56 PM  GAD 7 : Generalized Anxiety Score  Nervous, Anxious, on Edge 0 0  2 1  Control/stop worrying 0 0 0 0  Worry too much - different things 0 0 0 0  Trouble relaxing 0 0 3 0  Restless 0 0 0 0  Easily annoyed or irritable 1 0 2 0  Afraid - awful might happen 0 0 0 0  Total GAD 7 Score 1 0 7 1  Anxiety Difficulty Not difficult at all Not difficult at all Somewhat difficult Not difficult at all   Lab Results  Component Value Date   WBC 5.5 05/13/2023   HGB 13.1 05/13/2023   HCT 38.7 05/13/2023   MCV 89 05/13/2023   PLT 315 05/13/2023     Chemistry      Component Value Date/Time   NA 141 05/13/2023 0828   K 3.9 05/13/2023 0828   CL 104 05/13/2023 0828   CO2 21 05/13/2023 0828   BUN 15 05/13/2023 0828   CREATININE 1.13 (H) 05/13/2023 0828   CREATININE 0.97 10/11/2016 1108      Component Value Date/Time   CALCIUM 9.1 05/13/2023 0828   ALKPHOS 57 05/13/2023 0828   AST 19 05/13/2023 0828   ALT 10 05/13/2023 0828   BILITOT 0.4 05/13/2023 0828     Fasting glucose 101 Vitamin D-OH 37.5  Lab Results  Component Value Date   HGBA1C 5.4 05/13/2023    ASSESSMENT/PLAN:  Phq and gad7 Prevnar-20  Discussed monthly self breast exams and yearly mammograms; at least 30 minutes of aerobic activity at least 5 days/week, weight-bearing exercise at least 2x/week;  proper sunscreen use reviewed; healthy diet, including goals of calcium and vitamin D intake and alcohol recommendations (less than or equal to 1 drink/day) reviewed; regular seatbelt use; changing batteries in smoke detectors, carbon monoxide detectors.  Immunization recommendations discussed--continue yearly flu shots, COVID boosters. Prevnar-20 *** Colonoscopy due again 06/2022.  F/u 1 year for CPE, sooner prn.

## 2023-05-21 NOTE — Patient Instructions (Incomplete)
  HEALTH MAINTENANCE RECOMMENDATIONS:  It is recommended that you get at least 30 minutes of aerobic exercise at least 5 days/week (for weight loss, you may need as much as 60-90 minutes). This can be any activity that gets your heart rate up. This can be divided in 10-15 minute intervals if needed, but try and build up your endurance at least once a week.  Weight bearing exercise is also recommended twice weekly.  Eat a healthy diet with lots of vegetables, fruits and fiber.  "Colorful" foods have a lot of vitamins (ie green vegetables, tomatoes, red peppers, etc).  Limit sweet tea, regular sodas and alcoholic beverages, all of which has a lot of calories and sugar.  Up to 1 alcoholic drink daily may be beneficial for women (unless trying to lose weight, watch sugars).  Drink a lot of water.  Calcium recommendations are 1200-1500 mg daily (1500 mg for postmenopausal women or women without ovaries), and vitamin D 1000 IU daily.  This should be obtained from diet and/or supplements (vitamins), and calcium should not be taken all at once, but in divided doses.  Monthly self breast exams and yearly mammograms for women over the age of 76 is recommended.  Sunscreen of at least SPF 30 should be used on all sun-exposed parts of the skin when outside between the hours of 10 am and 4 pm (not just when at beach or pool, but even with exercise, golf, tennis, and yard work!)  Use a sunscreen that says "broad spectrum" so it covers both UVA and UVB rays, and make sure to reapply every 1-2 hours.  Remember to change the batteries in your smoke detectors when changing your clock times in the spring and fall. Carbon monoxide detectors are recommended for your home.  Use your seat belt every time you are in a car, and please drive safely and not be distracted with cell phones and texting while driving.  We briefly discussed starting Buspar (buspirone) for anxiety. This would be in addition to the lexapro. Talk with  Herbert Seta and see if she thinks this would be a good idea or not. You likely need to see her more frequently.  Prevnar-20 is recommended (pneumonia vaccine). You can schedule a nurse visit or get this from the pharmacy [facility], which is PCV 21 is an acceptable alternative).

## 2023-05-22 ENCOUNTER — Ambulatory Visit: Admitting: Family Medicine

## 2023-05-22 ENCOUNTER — Encounter: Payer: Self-pay | Admitting: Family Medicine

## 2023-05-22 VITALS — BP 124/78 | HR 68 | Ht 62.5 in | Wt 176.0 lb

## 2023-05-22 DIAGNOSIS — Z23 Encounter for immunization: Secondary | ICD-10-CM

## 2023-05-22 DIAGNOSIS — Z0000 Encounter for general adult medical examination without abnormal findings: Secondary | ICD-10-CM

## 2023-05-22 DIAGNOSIS — R7301 Impaired fasting glucose: Secondary | ICD-10-CM | POA: Diagnosis not present

## 2023-05-22 DIAGNOSIS — F411 Generalized anxiety disorder: Secondary | ICD-10-CM | POA: Diagnosis not present

## 2023-05-22 DIAGNOSIS — N959 Unspecified menopausal and perimenopausal disorder: Secondary | ICD-10-CM | POA: Diagnosis not present

## 2023-05-22 DIAGNOSIS — E559 Vitamin D deficiency, unspecified: Secondary | ICD-10-CM | POA: Diagnosis not present

## 2023-05-22 DIAGNOSIS — Z6831 Body mass index (BMI) 31.0-31.9, adult: Secondary | ICD-10-CM | POA: Diagnosis not present

## 2023-05-22 MED ORDER — NORETHINDRONE-ETH ESTRADIOL 1-5 MG-MCG PO TABS: 1.0000 | ORAL_TABLET | Freq: Every day | ORAL | 3 refills | Status: AC

## 2023-05-22 MED ORDER — ESCITALOPRAM OXALATE 20 MG PO TABS: 20.0000 mg | ORAL_TABLET | Freq: Every day | ORAL | 3 refills | Status: AC

## 2023-06-11 ENCOUNTER — Other Ambulatory Visit: Payer: Self-pay | Admitting: Family Medicine

## 2023-06-11 DIAGNOSIS — Z1231 Encounter for screening mammogram for malignant neoplasm of breast: Secondary | ICD-10-CM

## 2023-06-12 ENCOUNTER — Ambulatory Visit (INDEPENDENT_AMBULATORY_CARE_PROVIDER_SITE_OTHER): Admitting: Podiatry

## 2023-06-12 DIAGNOSIS — L600 Ingrowing nail: Secondary | ICD-10-CM | POA: Diagnosis not present

## 2023-06-12 DIAGNOSIS — B351 Tinea unguium: Secondary | ICD-10-CM | POA: Diagnosis not present

## 2023-06-12 MED ORDER — TERBINAFINE HCL 250 MG PO TABS: 250.0000 mg | ORAL_TABLET | Freq: Every day | ORAL | 0 refills | Status: AC

## 2023-06-12 NOTE — Patient Instructions (Signed)

## 2023-06-15 NOTE — Progress Notes (Signed)
 Subjective: Chief Complaint  Patient presents with   Toe Pain    RM#12 Right foot big toe nail discomfort after procedure and needing clarification on nail fungus report.   58 year old female presents the office today with above concerns.  She said the nail has grown back and it is getting ingrown causing pain mostly to the side of the toenail.  Area is tender with pressure.  No drainage or pus at this time.  She also has questions or concern about nail fungus.  Objective: AAO x3, NAD DP/PT pulses palpable bilaterally, CRT less than 3 seconds Nail has grown and hypertrophic, dystrophic with subungual debris present.  There is incurvation bilateral nail border localized edema and erythema more from inflammation as opposed to infection.  No drainage or pus.  No pain with calf compression, swelling, warmth, erythema  Assessment: Ingrown toenail, onychomycosis  Plan: -All treatment options discussed with the patient including all alternatives, risks, complications.  -In regards to nail fungus we discussed with oral, topical as well as alternative treatments.  Reviewed the prior biopsy, culture report.  Will start terbinafine  and recheck blood work in 4 weeks.  Discussed side effects, success rates. -Recommended partial nail avulsion given the pain.  She is going to go out of town we will plan on doing this next week. -Patient encouraged to call the office with any questions, concerns, change in symptoms.   Charity Conch DPM

## 2023-06-18 ENCOUNTER — Ambulatory Visit (INDEPENDENT_AMBULATORY_CARE_PROVIDER_SITE_OTHER): Admitting: Podiatry

## 2023-06-18 DIAGNOSIS — L600 Ingrowing nail: Secondary | ICD-10-CM

## 2023-06-18 NOTE — Patient Instructions (Signed)

## 2023-06-18 NOTE — Progress Notes (Signed)
 Subjective: No chief complaint on file.   58 year old female presents the office today for ingrown toenail removal of her big toe on the right. She states it is still causing pain and she wants to go ahead and remove the nail corners.   She also reports that she does not want to proceed with the oral Lamisil , and she did not get get it filled. She wants to proceed with the compound medication through [facility].   Objective: AAO x3, NAD DP/PT pulses palpable bilaterally, CRT less than 3 seconds Nail has grown and hypertrophic, dystrophic with subungual debris present.  There is incurvation of the hallux nail border localized edema without any erythema There is tenderness to the nail border.  There is no hyperpigmentation of the nail.  No pain with calf compression, swelling, warmth, erythema  Assessment: Ingrown toenail, onychomycosis  Plan: -All treatment options discussed with the patient including all alternatives, risks, complications.  -Will plan for topical medication pending the healing of the procedure site.  -At this time, the patient is requesting partial nail removal with chemical matricectomy to the symptomatic portion of the nail. Risks and complications were discussed with the patient for which they understand and written consent was obtained. Under sterile conditions a total of 3 mL of a mixture of 2% lidocaine plain and 0.5% Marcaine plain was infiltrated in a hallux block fashion. Once anesthetized, the skin was prepped in sterile fashion. A tourniquet was then applied. Next the medial and lateral aspect of hallux nail border was then sharply excised making sure to remove the entire offending nail border. Once the nails were ensured to be removed area was debrided and the underlying skin was intact. There is no purulence identified in the procedure. Next phenol was then applied under standard conditions and copiously irrigated. Silvadene was applied. A dry sterile  dressing was applied. After application of the dressing the tourniquet was removed and there is found to be an immediate capillary refill time to the digit. The patient tolerated the procedure well any complications. Post procedure instructions were discussed the patient for which he verbally understood. Discussed signs/symptoms of infection and directed to call the office immediately should any occur or go directly to the emergency room. In the meantime, encouraged to call the office with any questions, concerns, changes symptoms.  Charity Conch DPM

## 2023-06-30 ENCOUNTER — Encounter: Payer: Self-pay | Admitting: Podiatry

## 2023-06-30 ENCOUNTER — Ambulatory Visit (INDEPENDENT_AMBULATORY_CARE_PROVIDER_SITE_OTHER): Admitting: Podiatry

## 2023-06-30 DIAGNOSIS — L539 Erythematous condition, unspecified: Secondary | ICD-10-CM | POA: Diagnosis not present

## 2023-06-30 MED ORDER — CEPHALEXIN 500 MG PO CAPS: 500.0000 mg | ORAL_CAPSULE | Freq: Three times a day (TID) | ORAL | 0 refills | Status: AC

## 2023-06-30 MED ORDER — EFINACONAZOLE 10 % EX SOLN: 1.0000 [drp] | Freq: Every day | CUTANEOUS | 11 refills | Status: AC

## 2023-06-30 NOTE — Patient Instructions (Addendum)
 Continue soaking in epsom salts twice a day followed by antibiotic ointment and a band-aid. Can leave uncovered at night. Continue this until completely healed.  If the area has not healed in 2 weeks, call the office for follow-up appointment, or sooner if any problems arise.  Monitor for any signs/symptoms of infection. Call the office immediately if any occur or go directly to the emergency room. Call with any questions/concerns.  --  Efinaconazole Topical solution What is this medication? EFINACONAZOLE (e FEE na KON a zole) treats fungal infections of the nails. It belongs to a group of medications called antifungals. It will not treat infections caused by bacteria or viruses. This medicine may be used for other purposes; ask your health care provider or pharmacist if you have questions. COMMON BRAND NAME(S): JUBLIA What should I tell my care team before I take this medication? They need to know if you have any of these conditions: An unusual or allergic reaction to efinaconazole, other medications, foods, dyes or preservatives Pregnant or trying to get pregnant Breast-feeding How should I use this medication? This medication is for external use only. Do not take by mouth. Wash your hands before and after use. Do not get it in your eyes. If you do, rinse your eyes with plenty of cool tap water. Use it as directed on the prescription label. Do not use it more often than directed. Use the medication for the full course as directed by your care team, even if you think you are better. Do not stop using it unless your care team tells you to stop it early. This medication comes with INSTRUCTIONS FOR USE. Ask your pharmacist for directions on how to use this medication. Read the information carefully. Talk to your pharmacist or care team if you have questions. Talk to your care team about the use of this medication in children. While it may be prescribed for children as young as 6 years for selected  conditions, precautions do apply. Overdosage: If you think you have taken too much of this medicine contact a poison control center or emergency room at once. NOTE: This medicine is only for you. Do not share this medicine with others. What if I miss a dose? If you miss a dose, use it as soon as you can. If it is almost time for your next dose, use only that dose. Do not use double or extra doses. What may interact with this medication? Interactions are not expected. Do not use any other skin products on the same area of skin without talking to your care team. This list may not describe all possible interactions. Give your health care provider a list of all the medicines, herbs, non-prescription drugs, or dietary supplements you use. Also tell them if you smoke, drink alcohol, or use illegal drugs. Some items may interact with your medicine. What should I watch for while using this medication? Visit your care team for regular checks on your progress. It may be some time before you see the benefit from this medication. Do not use nail polish or other nail cosmetic products on the treated nails. What side effects may I notice from receiving this medication? Side effects that you should report to your care team as soon as possible: Allergic reactions--skin rash, itching, hives, swelling of the face, lips, tongue, or throat Side effects that usually do not require medical attention (report to your care team if they continue or are bothersome): Ingrown nails Mild skin irritation, redness, or dryness  This list may not describe all possible side effects. Call your doctor for medical advice about side effects. You may report side effects to FDA at 1-800-FDA-1088. Where should I keep my medication? Keep out of the reach of children and pets. Store at room temperature between 20 and 25 degrees C (68 and 77 degrees F). Do not freeze. Keep the container tightly closed. Get rid of any unused medication after  the expiration date. This medication is flammable. Avoid exposure to heat, flame, and smoking. To get rid of medications that are no longer needed or have expired: Take the medication to a medication take-back program. Check with your pharmacy or law enforcement to find a location. If you cannot return the medication, ask your pharmacist or care team how to get rid of this medication safely. NOTE: This sheet is a summary. It may not cover all possible information. If you have questions about this medicine, talk to your doctor, pharmacist, or health care provider.  2024 Elsevier/Gold Standard (2021-04-23 00:00:00)

## 2023-07-02 NOTE — Progress Notes (Signed)
 Subjective: Chief Complaint  Patient presents with   Ingrown Toenail    RM#11 Follow up on ingrown procedure and right big toe showing signs of infection.     57 year old female presents the office today for follow-up evaluation after partial nail avulsion of the hallux toenail.  She has had some redness around the area but no drainage or pus and she has no pain associated with this.  She still been soaking in Epsom salts.  Objective: AAO x3, NAD DP/PT pulses palpable bilaterally, CRT less than 3 seconds Status post partial nail avulsion.  There is localized erythema but there is no drainage or pus, increased temperature or ascending cellulitis.  Scabbing is starting to form along the area the procedure sites. Some dried blood present but no other areas of hyperpigmentation. No pain with calf compression, swelling, warmth, erythema  Assessment: Status post ingrown toenail removal  Plan: -All treatment options discussed with the patient including all alternatives, risks, complications.  -Marked erythema that she is having is more from inflammation will start antibiotics.  Prescribed cephalexin .  Continue soaking Epsom salts With antibiotic ointment during the day but can leave the area open at nighttime. -Jublia for nail fungus but discussed to wait applying this until the procedure site is completely healed. -Monitor for any clinical signs or symptoms of infection and directed to call the office immediately should any occur or go to the ER.  Return in about 3 weeks (around 07/21/2023) for nail check .  Charity Conch DPM

## 2023-07-09 ENCOUNTER — Encounter: Payer: Self-pay | Admitting: Podiatry

## 2023-07-16 ENCOUNTER — Other Ambulatory Visit: Payer: Self-pay | Admitting: Family Medicine

## 2023-07-16 DIAGNOSIS — N959 Unspecified menopausal and perimenopausal disorder: Secondary | ICD-10-CM

## 2023-07-18 ENCOUNTER — Ambulatory Visit (INDEPENDENT_AMBULATORY_CARE_PROVIDER_SITE_OTHER): Admitting: Podiatry

## 2023-07-18 DIAGNOSIS — L600 Ingrowing nail: Secondary | ICD-10-CM

## 2023-07-18 DIAGNOSIS — B351 Tinea unguium: Secondary | ICD-10-CM | POA: Diagnosis not present

## 2023-07-22 ENCOUNTER — Ambulatory Visit
Admission: RE | Admit: 2023-07-22 | Discharge: 2023-07-22 | Disposition: A | Discharge status: Home / Self Care | Source: Ambulatory Visit | Attending: Family Medicine | Admitting: Family Medicine

## 2023-07-22 DIAGNOSIS — Z1231 Encounter for screening mammogram for malignant neoplasm of breast: Secondary | ICD-10-CM

## 2023-07-22 NOTE — Progress Notes (Signed)
 Subjective: Chief Complaint  Patient presents with   Nail Problem    Patient is here for nail discoloration of the right hallux.   58 year old female presents the office today for follow-up evaluation after partial nail avulsion of the hallux toenail.  She has not yet started using the topical medication she is not sure if she could start.  There is still some scabbing, discoloration on the corners.  She has no pain with the toenails she denies any swelling, redness or any drainage.  She has no other concerns today.  Objective: AAO x3, NAD DP/PT pulses palpable bilaterally, CRT less than 3 seconds Status post partial nail avulsion.  There is no significant edema, or any signs of infection today.  There are some slight erythema which I think is more from inflammation as opposed to infection.  There is no pain associated with that there is no increased temperature.  There are some scabbing present along the nail borders.  There is no open wound. No pain with calf compression, swelling, warmth, erythema  Assessment: Status post ingrown toenail removal  Plan: -All treatment options discussed with the patient including all alternatives, risks, complications.  -I think residual erythema is coming more from inflammation as opposed to infection.  Continue to monitor for any reoccurrence of infection.  Discussed washing with soap and water.  Keep the area covered during the day with shoes and socks to leave the area open at nighttime.  There is no open wound she can start with topical medication for nail fungus.   Return if symptoms worsen or fail to improve.  Charity Conch DPM

## 2024-03-01 ENCOUNTER — Other Ambulatory Visit: Payer: Self-pay | Admitting: Family Medicine

## 2024-03-01 DIAGNOSIS — F411 Generalized anxiety disorder: Secondary | ICD-10-CM

## 2024-03-04 ENCOUNTER — Ambulatory Visit: Payer: Self-pay | Admitting: Internal Medicine

## 2024-06-09 ENCOUNTER — Encounter: Payer: Self-pay | Admitting: Family Medicine
